# Patient Record
Sex: Female | Born: 1960 | Race: White | Hispanic: No | Marital: Married | Smoking: Never smoker
Health system: Southern US, Community
[De-identification: ages and names within clinical notes are randomized; demographics above are authoritative.]

## PROBLEM LIST (undated history)

## (undated) DIAGNOSIS — R112 Nausea with vomiting, unspecified: Secondary | ICD-10-CM

## (undated) DIAGNOSIS — N393 Stress incontinence (female) (male): Secondary | ICD-10-CM

## (undated) DIAGNOSIS — M1990 Unspecified osteoarthritis, unspecified site: Secondary | ICD-10-CM

## (undated) DIAGNOSIS — T8859XA Other complications of anesthesia, initial encounter: Secondary | ICD-10-CM

## (undated) DIAGNOSIS — Z973 Presence of spectacles and contact lenses: Secondary | ICD-10-CM

## (undated) DIAGNOSIS — N649 Disorder of breast, unspecified: Secondary | ICD-10-CM

## (undated) HISTORY — PX: OTHER SURGICAL HISTORY: SHX169

## (undated) HISTORY — DX: Disorder of breast, unspecified: N64.9

---

## 1979-07-13 HISTORY — PX: OTHER SURGICAL HISTORY: SHX169

## 1999-07-13 HISTORY — PX: GANGLION CYST EXCISION: SHX1691

## 2005-11-09 HISTORY — PX: OTHER SURGICAL HISTORY: SHX169

## 2006-11-16 ENCOUNTER — Encounter: Admission: RE | Admit: 2006-11-16 | Discharge: 2006-11-16 | Payer: Self-pay | Admitting: Obstetrics and Gynecology

## 2007-07-13 DIAGNOSIS — W57XXXA Bitten or stung by nonvenomous insect and other nonvenomous arthropods, initial encounter: Secondary | ICD-10-CM

## 2007-07-13 HISTORY — PX: OTHER SURGICAL HISTORY: SHX169

## 2007-07-13 HISTORY — DX: Bitten or stung by nonvenomous insect and other nonvenomous arthropods, initial encounter: W57.XXXA

## 2008-09-18 ENCOUNTER — Encounter: Admission: RE | Admit: 2008-09-18 | Discharge: 2008-09-18 | Payer: Self-pay | Admitting: Obstetrics and Gynecology

## 2009-07-12 HISTORY — PX: BLADDER SUSPENSION: SHX72

## 2009-07-12 HISTORY — PX: VAGINAL HYSTERECTOMY: SUR661

## 2009-07-12 HISTORY — PX: RECTOCELE REPAIR: SHX761

## 2010-07-12 HISTORY — PX: UPPER GI ENDOSCOPY: SHX6162

## 2015-10-20 DIAGNOSIS — Z6820 Body mass index (BMI) 20.0-20.9, adult: Secondary | ICD-10-CM | POA: Diagnosis not present

## 2015-10-20 DIAGNOSIS — R1032 Left lower quadrant pain: Secondary | ICD-10-CM | POA: Diagnosis not present

## 2015-11-12 DIAGNOSIS — H6240 Otitis externa in other diseases classified elsewhere, unspecified ear: Secondary | ICD-10-CM | POA: Diagnosis not present

## 2015-11-12 DIAGNOSIS — B369 Superficial mycosis, unspecified: Secondary | ICD-10-CM | POA: Diagnosis not present

## 2015-11-12 DIAGNOSIS — Z6820 Body mass index (BMI) 20.0-20.9, adult: Secondary | ICD-10-CM | POA: Diagnosis not present

## 2015-11-17 DIAGNOSIS — R102 Pelvic and perineal pain: Secondary | ICD-10-CM | POA: Diagnosis not present

## 2015-11-17 DIAGNOSIS — Z90710 Acquired absence of both cervix and uterus: Secondary | ICD-10-CM | POA: Diagnosis not present

## 2015-12-09 DIAGNOSIS — H9211 Otorrhea, right ear: Secondary | ICD-10-CM | POA: Diagnosis not present

## 2015-12-09 DIAGNOSIS — Z6820 Body mass index (BMI) 20.0-20.9, adult: Secondary | ICD-10-CM | POA: Diagnosis not present

## 2015-12-12 DIAGNOSIS — H60311 Diffuse otitis externa, right ear: Secondary | ICD-10-CM | POA: Diagnosis not present

## 2016-01-02 DIAGNOSIS — Z6820 Body mass index (BMI) 20.0-20.9, adult: Secondary | ICD-10-CM | POA: Diagnosis not present

## 2016-01-02 DIAGNOSIS — H60311 Diffuse otitis externa, right ear: Secondary | ICD-10-CM | POA: Diagnosis not present

## 2016-06-11 DIAGNOSIS — H60543 Acute eczematoid otitis externa, bilateral: Secondary | ICD-10-CM | POA: Diagnosis not present

## 2016-06-11 DIAGNOSIS — Z6820 Body mass index (BMI) 20.0-20.9, adult: Secondary | ICD-10-CM | POA: Diagnosis not present

## 2016-07-13 DIAGNOSIS — Z01419 Encounter for gynecological examination (general) (routine) without abnormal findings: Secondary | ICD-10-CM | POA: Diagnosis not present

## 2016-07-13 DIAGNOSIS — Z6820 Body mass index (BMI) 20.0-20.9, adult: Secondary | ICD-10-CM | POA: Diagnosis not present

## 2016-07-13 DIAGNOSIS — N951 Menopausal and female climacteric states: Secondary | ICD-10-CM | POA: Diagnosis not present

## 2017-04-04 DIAGNOSIS — Z6820 Body mass index (BMI) 20.0-20.9, adult: Secondary | ICD-10-CM | POA: Diagnosis not present

## 2017-04-04 DIAGNOSIS — K591 Functional diarrhea: Secondary | ICD-10-CM | POA: Diagnosis not present

## 2017-10-21 DIAGNOSIS — Z780 Asymptomatic menopausal state: Secondary | ICD-10-CM | POA: Diagnosis not present

## 2017-10-21 DIAGNOSIS — R159 Full incontinence of feces: Secondary | ICD-10-CM | POA: Diagnosis not present

## 2017-10-21 DIAGNOSIS — Z0000 Encounter for general adult medical examination without abnormal findings: Secondary | ICD-10-CM | POA: Diagnosis not present

## 2017-10-21 DIAGNOSIS — Z833 Family history of diabetes mellitus: Secondary | ICD-10-CM | POA: Diagnosis not present

## 2017-10-21 DIAGNOSIS — Z1331 Encounter for screening for depression: Secondary | ICD-10-CM | POA: Diagnosis not present

## 2017-11-29 DIAGNOSIS — K580 Irritable bowel syndrome with diarrhea: Secondary | ICD-10-CM | POA: Diagnosis not present

## 2017-11-29 DIAGNOSIS — R159 Full incontinence of feces: Secondary | ICD-10-CM | POA: Diagnosis not present

## 2018-01-02 DIAGNOSIS — H60543 Acute eczematoid otitis externa, bilateral: Secondary | ICD-10-CM | POA: Diagnosis not present

## 2018-02-03 DIAGNOSIS — M19042 Primary osteoarthritis, left hand: Secondary | ICD-10-CM | POA: Diagnosis not present

## 2018-02-03 DIAGNOSIS — M2550 Pain in unspecified joint: Secondary | ICD-10-CM | POA: Diagnosis not present

## 2018-02-03 DIAGNOSIS — M19041 Primary osteoarthritis, right hand: Secondary | ICD-10-CM | POA: Diagnosis not present

## 2018-03-03 DIAGNOSIS — M19049 Primary osteoarthritis, unspecified hand: Secondary | ICD-10-CM | POA: Diagnosis not present

## 2019-01-01 DIAGNOSIS — Z1331 Encounter for screening for depression: Secondary | ICD-10-CM | POA: Diagnosis not present

## 2019-01-01 DIAGNOSIS — N951 Menopausal and female climacteric states: Secondary | ICD-10-CM | POA: Diagnosis not present

## 2019-01-01 DIAGNOSIS — Z79899 Other long term (current) drug therapy: Secondary | ICD-10-CM | POA: Diagnosis not present

## 2019-01-01 DIAGNOSIS — Z780 Asymptomatic menopausal state: Secondary | ICD-10-CM | POA: Diagnosis not present

## 2019-01-01 DIAGNOSIS — Z136 Encounter for screening for cardiovascular disorders: Secondary | ICD-10-CM | POA: Diagnosis not present

## 2019-01-01 DIAGNOSIS — E538 Deficiency of other specified B group vitamins: Secondary | ICD-10-CM | POA: Diagnosis not present

## 2019-03-13 DIAGNOSIS — H524 Presbyopia: Secondary | ICD-10-CM | POA: Diagnosis not present

## 2019-03-16 DIAGNOSIS — M8589 Other specified disorders of bone density and structure, multiple sites: Secondary | ICD-10-CM | POA: Diagnosis not present

## 2019-03-16 DIAGNOSIS — Z1231 Encounter for screening mammogram for malignant neoplasm of breast: Secondary | ICD-10-CM | POA: Diagnosis not present

## 2019-05-15 DIAGNOSIS — G5603 Carpal tunnel syndrome, bilateral upper limbs: Secondary | ICD-10-CM | POA: Diagnosis not present

## 2019-05-15 DIAGNOSIS — M7711 Lateral epicondylitis, right elbow: Secondary | ICD-10-CM | POA: Diagnosis not present

## 2019-05-23 DIAGNOSIS — G5601 Carpal tunnel syndrome, right upper limb: Secondary | ICD-10-CM | POA: Diagnosis not present

## 2019-05-30 DIAGNOSIS — G5601 Carpal tunnel syndrome, right upper limb: Secondary | ICD-10-CM | POA: Diagnosis not present

## 2019-05-30 DIAGNOSIS — M1711 Unilateral primary osteoarthritis, right knee: Secondary | ICD-10-CM | POA: Diagnosis not present

## 2021-02-02 ENCOUNTER — Other Ambulatory Visit: Payer: Self-pay | Admitting: Nurse Practitioner

## 2021-02-02 DIAGNOSIS — N6489 Other specified disorders of breast: Secondary | ICD-10-CM

## 2021-02-09 DIAGNOSIS — N649 Disorder of breast, unspecified: Secondary | ICD-10-CM

## 2021-02-09 HISTORY — DX: Disorder of breast, unspecified: N64.9

## 2021-02-16 ENCOUNTER — Other Ambulatory Visit: Payer: Self-pay | Admitting: Nurse Practitioner

## 2021-02-16 ENCOUNTER — Ambulatory Visit
Admission: RE | Admit: 2021-02-16 | Discharge: 2021-02-16 | Disposition: A | Discharge status: Home / Self Care | Payer: Medicaid Other | Source: Ambulatory Visit | Attending: Nurse Practitioner | Admitting: Nurse Practitioner

## 2021-02-16 ENCOUNTER — Other Ambulatory Visit: Payer: Self-pay

## 2021-02-16 DIAGNOSIS — N6489 Other specified disorders of breast: Secondary | ICD-10-CM

## 2021-02-16 HISTORY — PX: BREAST BIOPSY: SHX20

## 2021-02-18 ENCOUNTER — Encounter: Payer: Self-pay | Admitting: Obstetrics & Gynecology

## 2021-02-18 ENCOUNTER — Ambulatory Visit: Payer: 59 | Admitting: Obstetrics & Gynecology

## 2021-02-18 ENCOUNTER — Other Ambulatory Visit: Payer: Self-pay

## 2021-02-18 VITALS — BP 147/66 | HR 76 | Ht 62.0 in | Wt 114.0 lb

## 2021-02-18 DIAGNOSIS — N993 Prolapse of vaginal vault after hysterectomy: Secondary | ICD-10-CM

## 2021-02-18 DIAGNOSIS — N8110 Cystocele, unspecified: Secondary | ICD-10-CM

## 2021-02-18 NOTE — Progress Notes (Signed)
History:  60 y.o. [id number] here today for eval of presumed pelvic organ prolaspe. Pt reports a bluge of an 'egg' from the vagina. This has been present for ~1-2 months. Pt reports a prior surgery in 2011 done by her GYN in conjunction with urology. She knows that she had vaginal repair of a rectocele with a hysterectomy and repair of bladder prolaspe. She reports urinary retention prior to the procedure and had to have her bladder drained of large amounts of urine prior to the procedure. She is not clear on the other procedures. She recently (<1 week prev) passed sutures from her rectum. Pt brought the strands of several sutures that she passed from the rectum. Pt also reports leakage of urine. She wakes multiple times in the night and has to change her PJs due to leaking of urine.   Pt is married and is sexually active.   The following portions of the patient's history were reviewed and updated as appropriate: allergies, current medications, past family history, past medical history, past social history, past surgical history and problem list.  Review of Systems:  Pertinent items are noted in HPI.    Objective:  Physical Exam Blood pressure (!) 147/66, pulse 76, height '5\' 2"'$  (1.575 m), weight 114 lb (51.7 kg).  CONSTITUTIONAL: Well-developed, well-nourished female in no acute distress.  HENT:  Normocephalic, atraumatic EYES: Conjunctivae and EOM are normal. No scleral icterus.  NECK: Normal range of motion SKIN: Skin is warm and dry. No rash noted. Not diaphoretic.No pallor. [place]: Alert and oriented to person, place, and time. Normal coordination.  Abd: Soft, nontender and nondistended. No incisions.  Pelvic: Normal appearing external genitalia; The vagina mucosa is 'bunched up' in the midline. There is a cystocele that prolapses to the introitus. The vaginal cuff is also prolapsed. The rectal exam suggests a sling around the rectum and it feels like there are sutures that are palpable. The  rectum does not appear to be prolapsed.     Labs and Imaging MM CLIP PLACEMENT LEFT  Result Date: 02/16/2021 CLINICAL DATA:  Evaluate post biopsy marker clip placements following stereotactic core needle biopsy architectural distortions, 1 in each breast. EXAM: 3D DIAGNOSTIC BILATERAL MAMMOGRAM POST STEREOTACTIC BIOPSY COMPARISON:  Previous exam(s). FINDINGS: 3D Mammographic images were obtained following stereotactic guided biopsy of a lateral left breast architectural distortion. The biopsy marking clip is in expected position at the site of biopsy. 3D Mammographic images were obtained following stereotactic guided biopsy of a right breast architectural distortion. The biopsy marking clip is in expected position at the site of biopsy. IMPRESSION: Appropriate positioning of the coil shaped biopsy marking clip at the site of biopsy in the lateral left breast. Appropriate positioning of the ribbon shaped biopsy marking clip at the site of biopsy in the lateral right breast. Final Assessment: Post Procedure Mammograms for Marker Placement Electronically Signed   By: Lajean Manes M.D.   On: 02/16/2021 09:51  MM CLIP PLACEMENT RIGHT  Result Date: 02/16/2021 CLINICAL DATA:  Evaluate post biopsy marker clip placements following stereotactic core needle biopsy architectural distortions, 1 in each breast. EXAM: 3D DIAGNOSTIC BILATERAL MAMMOGRAM POST STEREOTACTIC BIOPSY COMPARISON:  Previous exam(s). FINDINGS: 3D Mammographic images were obtained following stereotactic guided biopsy of a lateral left breast architectural distortion. The biopsy marking clip is in expected position at the site of biopsy. 3D Mammographic images were obtained following stereotactic guided biopsy of a right breast architectural distortion. The biopsy marking clip is in expected position at the site  of biopsy. IMPRESSION: Appropriate positioning of the coil shaped biopsy marking clip at the site of biopsy in the lateral left breast.  Appropriate positioning of the ribbon shaped biopsy marking clip at the site of biopsy in the lateral right breast. Final Assessment: Post Procedure Mammograms for Marker Placement Electronically Signed   By: Lajean Manes M.D.   On: 02/16/2021 09:51  MM LT BREAST BX W LOC DEV 1ST LESION IMAGE BX SPEC STEREO GUIDE  Result Date: 02/16/2021 CLINICAL DATA:  Patient presents for stereotactic core needle biopsy of architectural distortions in each breast. EXAM: LEFT BREAST STEREOTACTIC CORE NEEDLE BIOPSY: #1. RIGHT BREAST STEREOTACTIC CORE NEEDLE BIOPSY: #2. COMPARISON:  Previous exams. FINDINGS: The patient and I discussed the procedure of stereotactic-guided biopsy including benefits and alternatives. We discussed the high likelihood of a successful procedure. We discussed the risks of the procedure including infection, bleeding, tissue injury, clip migration, and inadequate sampling. Informed written consent was given. The usual time out protocol was performed immediately prior to the procedure. Lesion #1: Left breast, lower, outer quadrant. Using sterile technique and 1% Lidocaine as local anesthetic, under stereotactic guidance, a 9 gauge vacuum assisted device was used to perform core needle biopsy of calcifications in the lower outer quadrant of the left breast using a superior approach. Lesion quadrant: Lower outer quadrant At the conclusion of the procedure, coil shaped tissue marker clip was deployed into the biopsy cavity. Lesion #2: Right breast, lower, outer quadrant. Using sterile technique and 1% Lidocaine as local anesthetic, under stereotactic guidance, a 9 gauge vacuum assisted device was used to perform core needle biopsy of calcifications in the lower outer quadrant of the left breast using a superior approach. Lesion quadrant: Lower outer quadrant At the conclusion of the procedure, ribbon shaped tissue marker clip was deployed into the biopsy cavity. Follow-up 2-view mammogram was performed and  dictated separately. IMPRESSION: Stereotactic-guided biopsy of architectural distortions, 1 in each breast. No apparent complications. Electronically Signed   By: Lajean Manes M.D.   On: 02/16/2021 09:45  MM RT BREAST BX W LOC DEV 1ST LESION IMAGE BX SPEC STEREO GUIDE  Result Date: 02/16/2021 CLINICAL DATA:  Patient presents for stereotactic core needle biopsy of architectural distortions in each breast. EXAM: LEFT BREAST STEREOTACTIC CORE NEEDLE BIOPSY: #1. RIGHT BREAST STEREOTACTIC CORE NEEDLE BIOPSY: #2. COMPARISON:  Previous exams. FINDINGS: The patient and I discussed the procedure of stereotactic-guided biopsy including benefits and alternatives. We discussed the high likelihood of a successful procedure. We discussed the risks of the procedure including infection, bleeding, tissue injury, clip migration, and inadequate sampling. Informed written consent was given. The usual time out protocol was performed immediately prior to the procedure. Lesion #1: Left breast, lower, outer quadrant. Using sterile technique and 1% Lidocaine as local anesthetic, under stereotactic guidance, a 9 gauge vacuum assisted device was used to perform core needle biopsy of calcifications in the lower outer quadrant of the left breast using a superior approach. Lesion quadrant: Lower outer quadrant At the conclusion of the procedure, coil shaped tissue marker clip was deployed into the biopsy cavity. Lesion #2: Right breast, lower, outer quadrant. Using sterile technique and 1% Lidocaine as local anesthetic, under stereotactic guidance, a 9 gauge vacuum assisted device was used to perform core needle biopsy of calcifications in the lower outer quadrant of the left breast using a superior approach. Lesion quadrant: Lower outer quadrant At the conclusion of the procedure, ribbon shaped tissue marker clip was deployed into the biopsy cavity.  Follow-up 2-view mammogram was performed and dictated separately. IMPRESSION:  Stereotactic-guided biopsy of architectural distortions, 1 in each breast. No apparent complications. Electronically Signed   By: Lajean Manes M.D.   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 Will send Dr. Bayard Beaver a note as well.  Requested records from [facility] where surgery occurred and prev GYN records.   Total face-to-face time with patient, review of chart, discussion with consultant and coordination of care was 42mn.    Raelin Pixler L. Harraway-Smith, M.D., FCherlynn June

## 2021-02-18 NOTE — Progress Notes (Signed)
Patient has hx of hysterectomy with rectocele repair.  (Former patient of Dr. Kris Mouton).  Just recently had breast biopsies done. ([facility]) Kathrene Alu RN

## 2021-03-11 ENCOUNTER — Telehealth: Payer: Self-pay | Admitting: General Practice

## 2021-03-11 NOTE — Telephone Encounter (Signed)
Records requested from Dr. Tobie Poet (OB/GYN) who is now retired.  Records can be found in Care Everywhere.

## 2021-03-13 ENCOUNTER — Telehealth: Payer: Self-pay | Admitting: General Practice

## 2021-03-13 NOTE — Telephone Encounter (Signed)
ROI sent to [facility] for operation of bladder and hysterectomy notes.  Medical Records only had radiology report on file.  Radiology report can be found in [place] with a service date of 11/17/2015.

## 2021-05-05 ENCOUNTER — Ambulatory Visit (INDEPENDENT_AMBULATORY_CARE_PROVIDER_SITE_OTHER): Payer: 59 | Admitting: Obstetrics and Gynecology

## 2021-05-05 ENCOUNTER — Other Ambulatory Visit: Payer: Self-pay

## 2021-05-05 ENCOUNTER — Encounter: Payer: Self-pay | Admitting: Obstetrics and Gynecology

## 2021-05-05 VITALS — BP 154/76 | HR 82 | Ht 62.0 in | Wt 114.0 lb

## 2021-05-05 DIAGNOSIS — R159 Full incontinence of feces: Secondary | ICD-10-CM

## 2021-05-05 DIAGNOSIS — R350 Frequency of micturition: Secondary | ICD-10-CM | POA: Diagnosis not present

## 2021-05-05 DIAGNOSIS — N993 Prolapse of vaginal vault after hysterectomy: Secondary | ICD-10-CM

## 2021-05-05 DIAGNOSIS — N8110 Cystocele, unspecified: Secondary | ICD-10-CM | POA: Diagnosis not present

## 2021-05-05 DIAGNOSIS — N393 Stress incontinence (female) (male): Secondary | ICD-10-CM

## 2021-05-05 DIAGNOSIS — N3941 Urge incontinence: Secondary | ICD-10-CM | POA: Diagnosis not present

## 2021-05-05 LAB — POCT URINALYSIS DIPSTICK
Appearance: NORMAL
Bilirubin, UA: NEGATIVE
Blood, UA: NEGATIVE
Glucose, UA: NEGATIVE
Ketones, UA: NEGATIVE
Leukocytes, UA: NEGATIVE
Nitrite, UA: NEGATIVE
Protein, UA: NEGATIVE
Spec Grav, UA: >=1.03 — AB (ref 1.010–1.025)
Urobilinogen, UA: 0.2 E.U./dL
pH, UA: 5 (ref 5.0–8.0)

## 2021-05-05 NOTE — Patient Instructions (Addendum)
Accidental Bowel Leakage: Our goal is to achieve formed bowel movements daily or every-other-day without leakage.  You may need to try different combinations of the following options to find what works best for you.  Some management options include: Dietary changes (more leafy greens, vegetables and fruits; less processed foods) Fiber supplementation (Metamucil or something with psyllium as active ingredient) Over-the-counter imodium (tablets or liquid) to help solidify the stool and prevent leakage of stool.    URODYNAMICS (UDS) TEST INFORMATION  IMPORTANT: Please try to arrive with a comfortably full bladder!  Complete a 3 day bladder diary prior to your appointment.  What is UDS? Urodynamics is a bladder test used to evaluate how your bladder and urethra (tube you urinate out of) work to help find out the cause of your bladder symptoms and evaluate your bladder function in order to make the best treatment plan for you.   What to expect? A nurse will perform the test and will be with you during the entire exam. First we will have to empty your bladder on a special toilet.  After you have emptied your bladder, very small catheters (plastic tubing) will be placed into your bladder and into your vagina (or rectum). These special small catheters measure pressure to help measure your bladder function.  Your bladder will be gently filled with water and you will be asked to cough and strain at several different points during the test.   You will then be asked to empty your bladder in the special toilet with the catheters in place. Most patients can urinate (pee) easily with the catheters in place since the catheters are so small. In total this procedure lasts about 45 minutes to 1 hour.  After your test is completed, you will return (or possibly be seen the same day) to review the results, talk about treatment options and make a plan moving forward.

## 2021-05-05 NOTE — Progress Notes (Signed)
[facility] New Patient Evaluation and Consultation  Referring Provider: Lavonia Drafts* PCP: Earlyne Iba, [facility] Date of Service: 05/05/2021  SUBJECTIVE Chief Complaint: New Patient (Initial Visit) Latoya Anderson is a 60 [place]. female here for a consult on prolapse./PVR 12)  History of Present Illness: Latoya Anderson is a 75 [place]. White or Caucasian female seen in consultation at the request of Dr. Ihor Dow for evaluation of prolapse.    Review of records significant for: Has history of hysterectomy, rectocele/ bladder prolapse repair in 2011. Has noticed sutures passing through the rectum. On exam, noted a sling around the rectum and it feels like there are sutures that are palpable.   Urinary Symptoms: Leaks urine with lifting, during sex, with urgency, and while asleep. UUI> SUI- Leaks a few times a week.  Pad use: none She is bothered by her UI symptoms. Very bothered by leakage with intercourse.  Before she had her previous prolapse repair, had urinary retention.   Day time voids- every hour (to prevent bladder from getting too full).  Nocturia: 1-2 times per night to void. Voiding dysfunction: she does not empty her bladder well.  does not use a catheter to empty bladder.  When urinating, she feels a weak stream and dribbling after finishing Drinks: water  UTIs:  0  UTI's in the last year.   Denies history of blood in urine and kidney or bladder stones  Pelvic Organ Prolapse Symptoms:                  She Admits to a feeling of a bulge the vaginal area. It has been present for 4 months.  She Admits to seeing a bulge.  This bulge is bothersome. In 2011, Pt reports a the time of hysterectomy, and had a mesh "patch" placed over the rectum, and a mesh bladder sling.   Has had some pain in the abdomen, then had some sutures come out of the rectum. Since then, the pain has improved.   Bowel Symptom: Bowel movements: usually every day Stool  consistency: hard Straining: yes.  Splinting: no.  Incomplete evacuation: yes.  She Admits to accidental bowel leakage / fecal incontinence  Occurs: several times per month  Consistency with leakage: soft  or liquid Bowel regimen: none Last colonoscopy: Date 2009, Results- negative. Has a history of IBS. Declines colonoscopy at this time.   Sexual Function Sexually active: yes.  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   Allergies: Patient is allergic to galactose, penicillins, sulfa antibiotics, and trimethoprim.   Social History:  Social History   Tobacco Use   Smoking status: Never   Smokeless tobacco: Never  Vaping Use   Vaping Use: Never used  Substance Use Topics   Alcohol use: Not Currently   Drug use: Never    Relationship status: married She lives with husband.   She is not employed. Regular exercise: No History of abuse: No  Family History:   Family History  Problem Relation Age of Onset   Cancer Father        lung   Diabetes Mother    Hypertension Mother    Cancer Brother        throat     Review of Systems: Review of Systems  Constitutional:  Negative for  fever, malaise/fatigue and weight loss.  Respiratory:  Negative for cough, shortness of breath and wheezing.   Cardiovascular:  Negative for chest pain, palpitations and leg swelling.  Gastrointestinal:  Negative for abdominal pain and blood in stool.  Genitourinary:  Negative for dysuria.  Musculoskeletal:  Negative for myalgias.  Skin:  Negative for rash.  Neurological:  Negative for dizziness and headaches.  Endo/Heme/Allergies:  Does not bruise/bleed easily.  Psychiatric/Behavioral:  Negative for depression. The patient is not nervous/anxious.     OBJECTIVE Physical Exam: Vitals:   05/05/21 1407  BP: (!) 154/76  Pulse: 82  Weight: 114 lb (51.7 kg)  Height: 5\' 2"  (1.575 m)    Physical Exam Constitutional:      General: She is not in acute distress. Pulmonary:     Effort: Pulmonary effort is normal.  Abdominal:     General: There is no distension.     Palpations: Abdomen is soft.     Tenderness: There is no abdominal tenderness. There is no rebound.  Musculoskeletal:        General: No swelling. Normal range of motion.  Skin:    General: Skin is warm and dry.     Findings: No rash.  Neurological:     Mental Status: She is alert and oriented to person, place, and time.  Psychiatric:        Mood and Affect: Mood normal.        Behavior: Behavior normal.     GU / Detailed Urogynecologic Evaluation:  Pelvic Exam: Normal external female genitalia; Bartholin's and Skene's glands normal in appearance; urethral meatus normal in appearance, no urethral masses or discharge.   CST: negative  s/p hysterectomy: Speculum exam reveals normal vaginal mucosa with  atrophy and normal vaginal cuff.  Adnexa no mass, fullness, tenderness.  Borders of mesh palpated on posterior vagina, no mesh fibers visible or palpable.    Pelvic floor strength I/V, puborectalis II/V external anal sphincter I/V  Pelvic floor musculature: Right levator non-tender, Right obturator non-tender, Left  levator non-tender, Left obturator non-tender  POP-Q:   POP-Q  0                                            Aa   0  Ba  -5.5                                              C   3                                            Gh  3.5                                            Pb  9                                            tvl   -2                                            Ap  -2                                            Bp                                                 D     Rectal Exam:  Normal sphincter tone, no distal rectocele, enterocoele not present, no rectal masses or foreign bodies, no sign of dyssynergia when asking the patient to bear down.  Post-Void Residual (PVR) by Bladder Scan: In order to evaluate bladder emptying, we discussed obtaining a postvoid residual and she agreed to this procedure.  Procedure: The ultrasound unit was placed on the patient's abdomen in the suprapubic region after the patient had voided. A PVR of 12 ml was obtained by bladder scan.  Laboratory Results: POC urine: negative   ASSESSMENT AND PLAN Ms. Rominger is a 34 [place]. with:  1. Prolapse of anterior vaginal wall   2. Vaginal vault prolapse after hysterectomy   3. Urinary frequency   4. Urge incontinence   5. SUI (stress urinary incontinence, female)   6. Incontinence of feces, unspecified fecal incontinence type    Stage II anterior, Stage I posterior, Stage I apical prolapse - For treatment of pelvic organ prolapse, we discussed options for management including expectant management, conservative management, and surgical management, such as Kegels, a pessary, pelvic floor physical therapy, and specific surgical procedures. - We discussed two options for prolapse repair:  1) vaginal repair without mesh - Pros - safer, no mesh complications - Cons - not as strong as mesh repair, higher risk of recurrence  2) laparoscopic repair with  mesh - Pros - stronger, better long-term success - Cons - risks of mesh implant (erosion into vagina or bladder, adhering to the rectum, pain) - these risks are lower than with a vaginal mesh  but still exist - She is interested in the procedure with higher success rate, robotic sacrocolpopexy.   2. Incontinence - Urge predominant. She is most bothered by leakage with intercourse which is typically SUI, but unclear with her history. Due to this and her history of prior sling, will undergo urodynamic testing. We discussed that urge incontinence will not necessarily improve with prolapse repair or repeat sling.   3. Fecal incontinence - Treatment options include anti-diarrhea medication (loperamide/ Imodium OTC or prescription lomotil), fiber supplements, physical therapy, and possible sacral neuromodulation or surgery.   - She will start with adding psyllium daily. - We also discussed the need for updated colonoscopy. She declines referral today- states she has other testing to be completed first.   Return for urodynamic testing  [name], MD   Medical Decision Making:  - Reviewed/ ordered a clinical laboratory test - Reviewed/ ordered medicine test - Review and summation of prior records

## 2021-05-19 ENCOUNTER — Ambulatory Visit: Payer: 59 | Admitting: Surgery

## 2021-05-19 ENCOUNTER — Encounter: Payer: Self-pay | Admitting: Surgery

## 2021-05-19 ENCOUNTER — Other Ambulatory Visit: Payer: Self-pay

## 2021-05-19 VITALS — BP 152/76 | HR 83 | Temp 98.1°F | Ht 62.5 in | Wt 115.6 lb

## 2021-05-19 DIAGNOSIS — N6091 Unspecified benign mammary dysplasia of right breast: Secondary | ICD-10-CM

## 2021-05-19 DIAGNOSIS — N6092 Unspecified benign mammary dysplasia of left breast: Secondary | ICD-10-CM

## 2021-05-19 DIAGNOSIS — N6489 Other specified disorders of breast: Secondary | ICD-10-CM

## 2021-05-19 NOTE — Progress Notes (Signed)
Patient ID: Latoya Anderson, female   DOB: 02-28-1961, 60 y.o.   MRN: [id number]  Chief Complaint: Bilateral complex sclerosing lesions.  History of Present Illness Latoya Anderson is a 60 y.o. 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  Patient with history of hormonal replacement therapy following her hysterectomy in 2011.  She reports the presence of both ovaries, but apparently is quite dependent upon her supplemental estrogen for her stated wellbeing, emotional balance, comfort.  She had gone I believe 5 to 6 years since her prior screening mammogram, her prescriber insisted on her having a mammogram and she followed through.  Her family history is significant for a maternal aunt.  We will apparently had cancer relatively early in life. She reports she began menstruating at the age of 58, she has had 3 pregnancy with the first delivered at the age of 60 years old.  She reports she breast-fed.  She has some breast discomfort.  But denies any known skin changes, nipple discharge or mass.  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    Family History Family History  Problem Relation Age of Onset   Diabetes Mother    Hypertension Mother    Cancer Father        lung   Cancer Brother        throat      Social History Social History   Tobacco Use   Smoking status: Never   Smokeless tobacco: Never  Vaping Use   Vaping Use: Never used  Substance Use Topics   Alcohol use: Not Currently   Drug use: Never        Review of Systems  Constitutional: Negative.   HENT: Negative.    Eyes: Negative.   Respiratory: Negative.    Cardiovascular: Negative.   Gastrointestinal: Negative.   Musculoskeletal: Negative.   Skin: Negative.   Neurological: Negative.   Psychiatric/Behavioral: Negative.       Physical Exam Blood pressure (!) 152/76, pulse 83, temperature 98.1 F (36.7 C), temperature source Oral, height 5' 2.5" (1.588 m), weight 115 lb 9.6 oz (52.4 kg), SpO2 98 %. Last Weight  Most recent update: 05/19/2021 10:10 AM    Weight  52.4 kg (115 lb 9.6 oz)             CONSTITUTIONAL: Well developed, and nourished, appropriately responsive and aware without distress.   EYES: Sclera non-icteric.   EARS, NOSE, MOUTH AND THROAT: Mask worn.  Hearing is intact to voice.  NECK: Trachea is midline, and there is no jugular venous distension.  LYMPH NODES:  Lymph nodes in the neck are not enlarged. RESPIRATORY:  Lungs are clear, and breath sounds are equal bilaterally. Normal respiratory effort without pathologic use of accessory muscles. CARDIOVASCULAR: Heart is regular in rate and rhythm. GI: The abdomen is soft, nontender, and nondistended.  GU: Levada Dy is present as chaperone: Breast examination completed, there is no evidence of suspicious nor dominant nodularity or masses present.  No skin changes, no nipple discharge or nipple areolar complex skin changes. MUSCULOSKELETAL:  Symmetrical muscle  tone appreciated in all four extremities.    SKIN: Skin turgor is normal. No pathologic skin lesions appreciated.  NEUROLOGIC:  Motor and sensation appear grossly normal.  Cranial nerves are grossly without defect. PSYCH:  Alert and oriented to person, place and time. Affect is appropriate for situation.  Data Reviewed I have personally reviewed what is currently available of the patient's imaging, recent labs and medical records.   Labs:  No flowsheet data found. No flowsheet data found.    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 This was found to be concordant by Dr. Lajean Manes, with surgical consultation to discuss options.   Pathology revealed COMPLEX SCLEROSING LESION of the RIGHT breast, lower outer quadrant, (ribbon clip). This was found to be concordant by Dr. Lajean Manes, with surgical consultation to discuss options.   Pathology results were discussed with the patient by telephone. The patient reported doing well after the biopsies with tenderness at the sites. Post biopsy instructions and care were reviewed and questions were answered. The patient was encouraged to call [facility] for any additional concerns. My direct phone number was provided.   Surgical consultation has been arranged with Dr. Rolm Bookbinder at [facility] on March 05, 2021.   NOTE: If surgical excision is not pursued, consider BILATERAL diagnostic mammogram every 6 months for 2 years.   Pathology results reported by Terie Purser, RN on 02/19/2021.     Electronically Signed   By: Lajean Manes M.D.   On: 02/19/2021 08:15    Addended by Lajean Manes, MD on 02/19/2021 10:24 AM   Study Result  Narrative & Impression  CLINICAL DATA:  Patient presents for stereotactic core needle biopsy of  architectural distortions in each breast.   EXAM: LEFT BREAST STEREOTACTIC CORE NEEDLE BIOPSY: #1.   RIGHT BREAST STEREOTACTIC CORE NEEDLE BIOPSY: #2.   COMPARISON:  Previous exams.   FINDINGS: The patient and I discussed the procedure of stereotactic-guided biopsy including benefits and alternatives. We discussed the high likelihood of a successful procedure. We discussed the risks of the procedure including infection, bleeding, tissue injury, clip migration, and inadequate sampling. Informed written consent was given. The usual time out protocol was performed immediately prior to the procedure.   Lesion #1: Left breast, lower, outer quadrant.   Using sterile technique and 1% Lidocaine as local anesthetic, under stereotactic guidance, a 9 gauge vacuum assisted device was used to perform core needle biopsy of calcifications in the lower outer quadrant of the left breast using a superior approach.   Lesion quadrant: Lower outer quadrant   At the conclusion of the procedure, coil shaped tissue marker clip was deployed into the biopsy cavity.   Lesion #2: Right breast, lower, outer quadrant.   Using  sterile technique and 1% Lidocaine as local anesthetic, under stereotactic guidance, a 9 gauge vacuum assisted device was used to perform core needle biopsy of calcifications in the lower outer quadrant of the left breast using a superior approach.   Lesion quadrant: Lower outer quadrant   At the conclusion of the procedure, ribbon shaped tissue marker clip was deployed into the biopsy cavity.   Follow-up 2-view mammogram was performed and dictated separately.   IMPRESSION: Stereotactic-guided biopsy of architectural distortions, 1 in each breast. No apparent complications.   Electronically Signed: By: Lajean Manes M.D. On: 02/16/2021 09:45   Within last 24 hrs: No results found.  Assessment    Bilateral complex sclerosing lesions, usual ductal hyperplasia in a  patient who continues to take estrogen replacement therapy.  There are no problems to display for this patient.   Plan    Reviewed the data below.  We discussed options including close observation with biannual imaging and clinical examinations, versus proceeding with bilateral lumpectomies, or bilateral mastectomies.  We discussed regardless there will always be a degree of uncertainty in terms of imaging, and eliminating premalignant risk. Since it has been since June for her original imaging, she would like to have her follow-up diagnostic bilateral mammography in December is on schedule.  We will anticipate reimaging every 6 months with clinical examination.  I am unconvinced that there is a role for MRI at this time.  Perhaps were better off getting her first set of follow-up imaging prior to launching off on any additional modalities.  Radial scars -- Complex Sclerosing Lesion (CSL) Radial scars, also called complex sclerosing lesions, are a pathologic diagnosis, usually discovered incidentally when a breast mass or radiologic abnormality is removed or biopsied. Occasionally, radial scars are large enough to be detected on mammography as suspicious spiculated masses, which cannot be reliably differentiated from spiculated carcinoma by imaging alone. Radial scars are characterized microscopically by a fibroelastic core with radiating ducts and lobules. In general, surgical excision is recommended when radial scars or complex sclerosing lesions are diagnosed on core biopsy, based on series showing that 8 to 17 percent of surgical specimens at subsequent excision are positive for malignancy. These high upgrade rates have led to the suggestion that these lesions may actually be premalignant lesions, progressing from scar to hyperplasia to carcinoma. This, however, is controversial, and more recent series suggest the presence of undiagnosed cancer is much lower, with upgrade rates in the range of 0.6 to  3.6 percent. The upgrade rate is even lower with large-volume (eg, vacuum-assisted) needle biopsy.   In a meta-analysis of over 3000 patients with radial scars, the upgrade rate after vacuum-assisted biopsy to invasive and in situ cancer was only 0 and 1 percent, respectively. In a study of 50 patients with radial scar who were followed by active surveillance, no patient progressed on interval imaging at 16 months. The current recommendations from the [place] of [employer] state that most radial scars "should be excised, although imaging follow-up is reasonable for small, image-detected radial scars that are completely removed or well-sampled with large-gauge devices and in the setting of imaging-pathology concordance".  No additional treatment beyond excision is needed for radial scars. The risk of subsequent breast cancer after excision in this population is small, and chemoprevention is not indicated.   Face-to-face time spent with the patient and accompanying care providers(if present) was 45 minutes, with more than 50% of the time spent counseling, educating, and coordinating care of the patient.  These notes generated with voice recognition software. I apologize for typographical errors.  Ronny Bacon M.D., FACS 05/19/2021, 12:06 PM

## 2021-05-19 NOTE — Patient Instructions (Addendum)
You may call [facility] at  [phone] to schedule your December Mammogram. If you have any concerns or questions, please feel free to call our office.  Atypical Hyperplasia of the Breast Atypical hyperplasia of the breast is a condition in which some cells in the breast are unusual or abnormal. The cells can be found in the ducts or lobules of the breast. Compared to normal breast cells, the cells seen in atypical hyperplasia: Grow faster than normal breast cells. Are organized in an unusual way. Grow in larger numbers than normal breast cells (overproduction). Are unusual in size and shape. Atypical hyperplasia is not a form of breast cancer. However, it may: Make you more likely to develop breast cancer. Turn into cancer (pre-cancerous), if it is not treated. What are the causes? The cause of this condition is not known. What increases the risk? The following factors may make you more likely to develop this condition: Age. Your risk increases as you get older. Certain inherited genes. Family or personal history of breast cancer. Having prior radiation treatments to your breasts or chest area. Obesity. Using or having used hormones, such as estrogen. Starting menstruation before the age of 41. Going through menopause after age 72. Drinking more than one alcoholic beverage a day. Never having given birth. Giving birth to your first child when you were over the age of 2. Not breastfeeding, if you have given birth. What are the signs or symptoms? There are no symptoms of this condition. How is this diagnosed? This condition is usually discovered during a routine X-ray study of the breasts (mammogram). If something concerning is found on a mammogram, a biopsy is performed. This means that a small sample of breast tissue is removed and sent to a lab. In most cases, breast cells can be removed through a needle. Sometimes, a small cut (incision) will need to be made in the  breast to remove a sample of breast tissue. Atypical hyperplasia can then be diagnosed by examining the breast cells under a microscope. How is this treated? This condition may be treated with: More frequent monitoring of breast health with: Clinical breast exams. Mammograms. Ultrasounds. MRIs. Medicines to keep the abnormal cells from spreading and becoming cancerous (chemoprevention). A lumpectomy. This is the removal of the area of abnormal cells, along with some of the normal tissue in the area. This may also be called breast-conserving surgery. A simple mastectomy. This is the removal of breast tissue, the nipple, and the circle of colored tissue around the nipple (areola). Sometimes, one or more lymph nodes from under the arm are also removed. A preventive mastectomy. This is the removal of both breasts. This is usually only done if you have a very high risk of developing breast cancer. Follow these instructions at home: Breast health Examine your breasts after every menstrual period. If you do not have menstrual periods, check your breasts on the first day of every month. Feel for changes in your breasts, such as: More tenderness. A new growth. A change in size or shape. A change in an existing lump. Alcohol use Do not drink alcohol if: Your health care provider tells you not to drink. You are pregnant, may be pregnant, or are planning to become pregnant. If you drink alcohol, limit how much you have to 0-1 drink a day. Be aware of how much alcohol is in your drink. In the [place] one drink equals one typical bottle of beer (12 oz), one-half glass of wine (  5 oz), or one shot of hard liquor (1 oz). General instructions Take over-the-counter and prescription medicines only as told by your health care provider. Eat a healthy diet. This includes low-fat dairy products, lean meats, and fiber-rich foods, such as fruits, vegetables, and whole grains. To get more fiber: Make sure half your  plate is filled with fruits or vegetables. Choose high-fiber foods such as whole-grain breads and cereals. Do not use any products that contain nicotine or tobacco, such as cigarettes and e-cigarettes. If you need help quitting, ask your health care provider. Keep all follow-up visits as told by your health care provider. This is important. Contact a health care provider if you have: A fever. A change in shape or size of your breasts or nipples. A change in the skin of your breast or nipples, such as a reddened or scaly area. Unusual discharge from your nipples. A lump or thick area that was not there before. Pain in your breasts. Anything that concerns you. Get help right away if you have: Trouble breathing. Chest pain. Redness of your breast and the redness is spreading. Summary Atypical hyperplasia of the breast is a condition in which some cells in the breast are unusual or abnormal. Atypical hyperplasia is not a form of breast cancer. However, it may put you at greater risk for developing breast cancer. This condition is usually discovered during a routine X-ray study of the breasts (mammogram). Examine your breasts after every menstrual period. If you do not have menstrual periods, check your breasts on the first day of every month. Let your health care provider know if you notice any changes in your breasts. This information is not intended to replace advice given to you by your health care provider. Make sure you discuss any questions you have with your health care provider. Document Revised: 08/02/2019 Document Reviewed: 07/28/2017 Elsevier Patient Education  2022 [employer].

## 2021-05-26 ENCOUNTER — Other Ambulatory Visit: Payer: Self-pay

## 2021-05-26 ENCOUNTER — Ambulatory Visit (INDEPENDENT_AMBULATORY_CARE_PROVIDER_SITE_OTHER): Payer: 59 | Admitting: Obstetrics and Gynecology

## 2021-05-26 VITALS — BP 190/73 | HR 85 | Wt 115.0 lb

## 2021-05-26 DIAGNOSIS — R350 Frequency of micturition: Secondary | ICD-10-CM | POA: Diagnosis not present

## 2021-05-26 DIAGNOSIS — N393 Stress incontinence (female) (male): Secondary | ICD-10-CM

## 2021-05-26 LAB — POCT URINALYSIS DIPSTICK
Appearance: NORMAL
Bilirubin, UA: NEGATIVE
Glucose, UA: NEGATIVE
Ketones, UA: NEGATIVE
Leukocytes, UA: NEGATIVE
Nitrite, UA: NEGATIVE
Protein, UA: NEGATIVE
Spec Grav, UA: 1.02 (ref 1.010–1.025)
Urobilinogen, UA: 0.2 E.U./dL
pH, UA: 5.5 (ref 5.0–8.0)

## 2021-05-26 NOTE — Patient Instructions (Signed)

## 2021-05-28 NOTE — Progress Notes (Signed)
[facility] Urodynamics Procedure  Referring Physician: Earlyne Iba, NP Date of Procedure: 05/26/2021  Latoya Anderson is a 60 y.o. female who presents for urodynamic evaluation. Indication(s) for study: SUI and UUI. Has previous sling for SUI.   Vital Signs: BP (!) 190/73   Pulse 85   Wt 115 lb (52.2 kg)   BMI 20.70 kg/m   Laboratory Results: A catheterized urine specimen revealed:  POC urine: negative  Voiding Diary: Not performed  Procedure Timeout:  The correct patient was verified and the correct procedure was verified. The patient was in the correct position and safety precautions were reviewed based on at the patient's history.  Urodynamic Procedure A 261F dual lumen urodynamics catheter was placed under sterile conditions into the patient's bladder. A 261F catheter was placed into the rectum in order to measure abdominal pressure. EMG patches were placed in the appropriate position.  All connections were confirmed and calibrations/adjusted made. Saline was instilled into the bladder through the dual lumen catheters.  Cough/valsalva pressures were measured periodically during filling.  Patient was allowed to void.  The bladder was then emptied of its residual.  UROFLOW: Revealed a Qmax of 30 mL/sec.  She voided 284 mL and had a residual of 8 mL.  It was a interrupted pattern and represented normal habits.  CMG: This was performed with sterile water in the sitting position at a fill rate of 20-30 mL/min.    First sensation of fullness was 28 mLs,  First urge was 50 mLs,  Strong urge was 66 mLs and  Capacity was 137 mLs  Stress incontinence was demonstrated Highest negative Barrier CLPP was 114 cmH20 at 100 ml. Lowest positive Barrier VLPP was 77 cmH20 at 100 ml.  Detrusor function was normal    Compliance:  normal.   UPP: MUCP with barrier reduction was 48 cm of water.    MICTURITION STUDY: Voiding was performed with reduction using scopettes in  the sitting position.  Pdet at Qmax was 11.3 cm of water.  Qmax was 8.1 mL/sec.  It was a normal pattern.  She voided 134 mL and had a residual of 3 mL.  It was a volitional void, sustained detrusor contraction was present and abdominal straining was present  EMG: This was performed with patches.  She had voluntary contractions, recruitment with fill was present and urethral sphincter was and was not relaxed with void.  The details of the procedure with the study tracings have been scanned into EPIC.   Urodynamic Impression:  1. Sensation was increased; capacity was reduced 2. Stress Incontinence was demonstrated at normal pressures; 3. Detrusor Overactivity was not demonstrated. 4. Emptying was normal with a normal PVR, a sustained detrusor contraction present,  abdominal straining present, dyssynergic urethral sphincter activity on EMG.  Plan: - The patient will follow up  to discuss the findings and treatment options.

## 2021-06-01 NOTE — Progress Notes (Signed)
[facility] Return Visit  SUBJECTIVE  History of Present Illness: Latoya Anderson is a 60 y.o. female seen in follow-up for after urodynamic testing to discuss surgery.   Urodynamic Impression:  1. Sensation was increased; capacity was reduced (137 ml) 2. Stress Incontinence was demonstrated at normal pressures; 3. Detrusor Overactivity was not demonstrated. 4. Emptying was normal with a normal PVR, a sustained detrusor contraction present,  abdominal straining present, dyssynergic urethral sphincter activity on EMG.  Past Medical History: Patient  has a past medical history of Breast disorder.   Past Surgical History: She  has a past surgical history that includes Vaginal hysterectomy (07/2009); Rectocele repair (07/2009); Bladder suspension (07/2009); galbladder (11/2005); left hand surgery (1981); and Ganglion cyst excision (Right, 2001).   Medications: She has a current medication list which includes the following prescription(s): acetaminophen, estradiol, ibuprofen, oxycodone, and polyethylene glycol powder.   Allergies: Patient is allergic to alpha-gal, galactose, penicillins, sulfa antibiotics, and trimethoprim.   Social History: Patient  reports that she has never smoked. She has never used smokeless tobacco. She reports that she does not drink alcohol and does not use drugs.      OBJECTIVE     Physical Exam: Vitals:   06/03/21 1133  BP: (!) 158/76  Pulse: 76   Gen: No apparent distress, A&O x 3.  Detailed Urogynecologic Evaluation:  Deferred. Prior exam showed:  POP-Q:    POP-Q   0                                            Aa   0                                           Ba   -5.5                                              C    3                                            Gh   3.5                                            Pb   9                                            tvl    -2                                            Ap    -2  Bp                                                  D        ASSESSMENT AND PLAN    [name] is a 60 y.o. with:  1. SUI (stress urinary incontinence, female)   2. Prolapse of anterior vaginal wall   3. Vaginal vault prolapse after hysterectomy     Plan for surgery: Exam under anesthesia, Robotic assisted sacrocolpopexy, midurethral sling, cystoscopy  - We reviewed the patient's specific anatomic and functional findings, with the assistance of diagrams, and together finalized the above procedure. The planned surgical procedures were discussed along with the surgical risks outlined below, which were also provided on a detailed handout. Additional treatment options including expectant management, conservative management, medical management were discussed where appropriate.  We reviewed the benefits and risks of each treatment option.   General Surgical Risks: For all procedures, there are risks of bleeding, infection, damage to surrounding organs including but not limited to bowel, bladder, blood vessels, ureters and nerves, and need for further surgery if an injury were to occur. These risks are all low with minimally invasive surgery.   There are risks of numbness and weakness at any body site or buttock/rectal pain.  It is possible that baseline pain can be worsened by surgery, either with or without mesh. If surgery is vaginal, there is also a low risk of possible conversion to laparoscopy or open abdominal incision where indicated. Very rare risks include blood transfusion, blood clot, heart attack, pneumonia, or death.   There is also a risk of short-term postoperative urinary retention with need to use a catheter. About half of patients need to go home from surgery with a catheter, which is then later removed in the office. The risk of long-term need for a catheter is very low. There is also a risk of worsening of overactive bladder.    Sling: The effectiveness of a midurethral vaginal mesh sling is approximately 85%, and thus, there will be times when you may leak urine after surgery, especially if your bladder is full or if you have a strong cough. There is a balance between making the sling tight enough to treat your leakage but not too tight so that you have long-term difficulty emptying your bladder. A mesh sling will not directly treat overactive bladder/urge incontinence and may worsen it.  There is an FDA safety notification on vaginal mesh procedures for prolapse but NOT mesh slings. We have extensive experience and training with mesh placement and we have close postoperative follow up to identify any potential complications from mesh. It is important to realize that this mesh is a permanent implant that cannot be easily removed. There are rare risks of mesh exposure (2-4%), pain with intercourse (0-7%), and infection (<1%). The risk of mesh exposure if more likely in a woman with risks for poor healing (prior radiation, poorly controlled diabetes, or immunocompromised). The risk of new or worsened chronic pain after mesh implant is more common in women with baseline chronic pain and/or poorly controlled anxiety or depression. Approximately 2-4% of patients will experience longer-term post-operative voiding dysfunction that may require surgical revision of the sling. We also reviewed that postoperatively, her stream may not be as strong as before surgery.   Prolapse (with or without mesh): Risk factors  for surgical failure  include things that put pressure on your pelvis and the surgical repair, including obesity, chronic cough, and heavy lifting or straining (including lifting children or adults, straining on the toilet, or lifting heavy objects such as furniture or anything weighing >25 lbs. Risks of recurrence is 20-30% with vaginal native tissue repair and a less than 10% with sacrocolpopexy with mesh.     Sacrocolpopexy: Mesh implants may provide more prolapse support, but do have some unique risks to consider. It is important to understand that mesh is permanent and cannot be easily removed. Risks of abdominal sacrocolpopexy mesh include mesh exposure (~3-6%), painful intercourse (recent studies show lower rates after surgery compared to before, with ~5-8% risk of new onset), and very rare risks of bowel or bladder injury or infection (<1%). The risk of mesh exposure is more likely in a woman with risks for poor healing (prior radiation, poorly controlled diabetes, or immunocompromised). The risk of new or worsened chronic pain after mesh implant is more common in women with baseline chronic pain and/or poorly controlled anxiety or depression. There is an FDA safety notification on vaginal mesh procedures for prolapse but NOT abdominal mesh procedures and therefore does not apply to your surgery. We have extensive experience and training with mesh placement and we have close postoperative follow up to identify any potential complications from mesh.    - For preop Visit:  She is required to have a visit within 30 days of her surgery.   Today we reviewed pre-operative preparation, peri-operative expectations, and post-operative instructions/recovery.  She was provided with instructional handouts. She understands not to take aspirin (>81mg ) or NSAIDs 7 days prior to surgery. Prescriptions provided for: Oxycodone 5mg  15 tabs, Ibuprofen 600mg  30 tabs, Tylenol 500mg  30 tabs, Miralax. These prescriptions will be sent prior to surgery.  - Medical clearance: not required  - Anticoagulant use: No - Medicaid Hysterectomy form: No - Accepts blood transfusion: Yes - Expected length of stay: outpatient  Request sent for surgery scheduling.   [name], MD  Time spent: I spent 35 minutes dedicated to the care of this patient on the date of this encounter to include pre-visit review of records,  face-to-face time with the patient discussing surgery and post visit documentation and ordering medication/ testing.

## 2021-06-03 ENCOUNTER — Encounter: Payer: Self-pay | Admitting: Obstetrics and Gynecology

## 2021-06-03 ENCOUNTER — Ambulatory Visit (INDEPENDENT_AMBULATORY_CARE_PROVIDER_SITE_OTHER): Payer: 59 | Admitting: Obstetrics and Gynecology

## 2021-06-03 ENCOUNTER — Other Ambulatory Visit: Payer: Self-pay

## 2021-06-03 VITALS — BP 158/76 | HR 76

## 2021-06-03 DIAGNOSIS — N393 Stress incontinence (female) (male): Secondary | ICD-10-CM | POA: Diagnosis not present

## 2021-06-03 DIAGNOSIS — N8110 Cystocele, unspecified: Secondary | ICD-10-CM | POA: Diagnosis not present

## 2021-06-03 DIAGNOSIS — N993 Prolapse of vaginal vault after hysterectomy: Secondary | ICD-10-CM

## 2021-06-03 MED ORDER — ACETAMINOPHEN 500 MG PO TABS: 500.0000 mg | ORAL_TABLET | Freq: Four times a day (QID) | ORAL | 0 refills | Status: AC | PRN

## 2021-06-03 MED ORDER — POLYETHYLENE GLYCOL 3350 17 GM/SCOOP PO POWD: 17.0000 g | Freq: Every day | ORAL | 0 refills | Status: AC

## 2021-06-03 MED ORDER — IBUPROFEN 600 MG PO TABS: 600.0000 mg | ORAL_TABLET | Freq: Four times a day (QID) | ORAL | 0 refills | Status: AC | PRN

## 2021-06-03 MED ORDER — OXYCODONE HCL 5 MG PO TABS: 5.0000 mg | ORAL_TABLET | ORAL | 0 refills | Status: AC | PRN

## 2021-06-18 ENCOUNTER — Other Ambulatory Visit: Payer: Self-pay

## 2021-06-18 ENCOUNTER — Encounter (HOSPITAL_BASED_OUTPATIENT_CLINIC_OR_DEPARTMENT_OTHER): Payer: Self-pay | Admitting: Obstetrics and Gynecology

## 2021-06-18 DIAGNOSIS — N993 Prolapse of vaginal vault after hysterectomy: Secondary | ICD-10-CM | POA: Diagnosis present

## 2021-06-18 NOTE — H&P (Signed)
[facility] Pre-Operative H&P  Subjective Chief Complaint: Latoya Anderson presents for a preoperative encounter.   History of Present Illness: Latoya Anderson is a 60 y.o. female who presents for preoperative visit.  She is scheduled to undergo Exam under anesthesia, Robotic assisted sacrocolpopexy, midurethral sling, cystoscopy on 06/23/21.  Her symptoms include recurrent vaginal bulge, and she was was found to have Stage II anterior, Stage I posterior, Stage I apical prolapse .   Urodynamic Impression:  1. Sensation was increased; capacity was reduced (137 ml) 2. Stress Incontinence was demonstrated at normal pressures; 3. Detrusor Overactivity was not demonstrated. 4. Emptying was normal with a normal PVR, a sustained detrusor contraction present,  abdominal straining present, dyssynergic urethral sphincter activity on EMG.    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   Family History  Problem Relation Age of Onset   Diabetes Mother    Hypertension Mother    Cancer Father        lung   Cancer Brother         throat    Social History   Tobacco Use   Smoking status: Never   Smokeless tobacco: Never  Vaping Use   Vaping Use: Never used  Substance Use Topics   Alcohol use: Never   Drug use: Never     Review of Systems was negative for a full 10 system review except as noted in the History of Present Illness.  No current facility-administered medications for this encounter.  Current Outpatient Medications:    Ascorbic Acid (VITAMIN C PO), Take by mouth., Disp: , Rfl:    CALCIUM PO, Take by mouth daily., Disp: , Rfl:    Cyanocobalamin (VITAMIN B 12 PO), Take by mouth as needed., Disp: , Rfl:    EPINEPHrine 0.3 mg/0.3 mL IJ SOAJ injection, Inject 0.3 mg into the muscle as needed for anaphylaxis., Disp: , Rfl:    OVER THE COUNTER MEDICATION, Vitamin b 6 prn, Disp: , Rfl:    Thiamine HCl (VITAMIN B-1 PO), Take by mouth as needed., Disp: , Rfl:    VITAMIN D PO, Take by mouth daily., Disp: , Rfl:    acetaminophen (TYLENOL) 500 MG tablet, Take 1 tablet (500 mg total) by mouth every 6 (six) hours as needed (pain). (Patient taking differently: Take 500 mg by mouth every 6 (six) hours as needed (pain). For after surgery), Disp: 30 tablet, Rfl:  0   estradiol (ESTRACE) 1 MG tablet, , Disp: , Rfl:    ibuprofen (ADVIL) 600 MG tablet, Take 1 tablet (600 mg total) by mouth every 6 (six) hours as needed. (Patient taking differently: Take 600 mg by mouth every 6 (six) hours as needed. For after surgery), Disp: 30 tablet, Rfl: 0   oxyCODONE (OXY IR/ROXICODONE) 5 MG immediate release tablet, Take 1 tablet (5 mg total) by mouth every 4 (four) hours as needed for severe pain. (Patient taking differently: Take 5 mg by mouth every 4 (four) hours as needed for severe pain. For after surgery), Disp: 15 tablet, Rfl: 0   polyethylene glycol powder (GLYCOLAX/MIRALAX) 17 GM/SCOOP powder, Take 17 g by mouth daily. Drink 17g (1 scoop) dissolved in water per day. (Patient taking differently: Take 17 g by mouth daily.  Drink 17g (1 scoop) dissolved in water per day. For after surgery), Disp: 255 g, Rfl: 0   Objective Gen: No apparent distress, A&O x 3.  Previous Pelvic Exam showed: POP-Q:    POP-Q   0                                            Aa   0                                           Ba   -5.5                                              C    3                                            Gh   3.5                                            Pb   9                                            tvl    -2                                            Ap   -2                                            Bp  D       Assessment/ Plan  Assessment: The patient is a 60 y.o. year old scheduled to undergo Exam under anesthesia, Robotic assisted sacrocolpopexy, midurethral sling, cystoscopy.    [name], MD

## 2021-06-18 NOTE — Progress Notes (Addendum)
Spoke w/ via phone for pre-op interview---pt Lab needs dos----  none per anesthesia surgery orders pending             Lab results------none COVID test -----patient states asymptomatic no test needed Arrive at -------530 am 06-23-2021 NPO after MN NO Solid Food.  Clear liquids from MN until---430 am Med rec completed Medications to take morning of surgery -----none Diabetic medication -----n/a Patient instructed no nail polish to be worn day of surgery Patient instructed to bring photo id and insurance card day of surgery Patient aware to have Driver (ride ) / caregiver    for 24 hours after surgery  spouse michael will stay Patient Special Instructions -----pt states she will be extended recovery, extended recovery instructions given Pre-Op special Istructions -----surgery orders req dr schroeder epic ib Patient verbalized understanding of instructions that were given at this phone interview. Patient denies shortness of breath, chest pain, fever, cough at this phone interview.

## 2021-06-22 NOTE — Anesthesia Preprocedure Evaluation (Addendum)
Anesthesia Evaluation  Patient identified by MRN, date of birth, ID band Patient awake    Reviewed: Allergy & Precautions, NPO status , Patient's Chart, lab work & pertinent test results  History of Anesthesia Complications (+) PONV and history of anesthetic complications  Airway Mallampati: II  TM Distance: >3 FB Neck ROM: Full    Dental no notable dental hx. (+) Dental Advisory Given, Teeth Intact   Pulmonary neg pulmonary ROS,    Pulmonary exam normal breath sounds clear to auscultation       Cardiovascular hypertension (some white coat HTN per pt, 140s SBP in preop- normally lower), Normal cardiovascular exam Rhythm:Regular Rate:Normal     Neuro/Psych negative neurological ROS  negative psych ROS   GI/Hepatic negative GI ROS, Neg liver ROS,   Endo/Other  negative endocrine ROS  Renal/GU negative Renal ROS Bladder dysfunction Female GU complaint     Musculoskeletal  (+) Arthritis , Osteoarthritis,    Abdominal   Peds  Hematology negative hematology ROS (+)   Anesthesia Other Findings   Reproductive/Obstetrics negative OB ROS                            Anesthesia Physical Anesthesia Plan  ASA: 1  Anesthesia Plan: General   Post-op Pain Management: Toradol IV (intra-op), Tylenol PO (pre-op) and Dilaudid IV   Induction: Intravenous  PONV Risk Score and Plan: 4 or greater and Ondansetron, Dexamethasone, Midazolam, Scopolamine patch - Pre-op, Treatment may vary due to age or medical condition, Diphenhydramine and Metaclopromide  Airway Management Planned: Oral ETT  Additional Equipment: None  Intra-op Plan:   Post-operative Plan: Extubation in OR  Informed Consent: I have reviewed the patients History and Physical, chart, labs and discussed the procedure including the risks, benefits and alternatives for the proposed anesthesia with the patient or authorized representative who  has indicated his/her understanding and acceptance.     Dental advisory given  Plan Discussed with: CRNA  Anesthesia Plan Comments:        Anesthesia Quick Evaluation

## 2021-06-23 ENCOUNTER — Ambulatory Visit (HOSPITAL_BASED_OUTPATIENT_CLINIC_OR_DEPARTMENT_OTHER)
Admission: RE | Admit: 2021-06-23 | Discharge: 2021-06-23 | Disposition: A | Discharge status: Home / Self Care | Payer: 59 | Attending: Obstetrics and Gynecology | Admitting: Obstetrics and Gynecology

## 2021-06-23 ENCOUNTER — Other Ambulatory Visit: Payer: Self-pay

## 2021-06-23 ENCOUNTER — Telehealth: Payer: Self-pay | Admitting: Obstetrics and Gynecology

## 2021-06-23 ENCOUNTER — Encounter (HOSPITAL_BASED_OUTPATIENT_CLINIC_OR_DEPARTMENT_OTHER): Payer: Self-pay | Admitting: Obstetrics and Gynecology

## 2021-06-23 ENCOUNTER — Ambulatory Visit (HOSPITAL_BASED_OUTPATIENT_CLINIC_OR_DEPARTMENT_OTHER): Payer: 59 | Admitting: Anesthesiology

## 2021-06-23 ENCOUNTER — Encounter (HOSPITAL_BASED_OUTPATIENT_CLINIC_OR_DEPARTMENT_OTHER)
Admission: RE | Disposition: A | Discharge status: Home / Self Care | Payer: Self-pay | Source: Home / Self Care | Attending: Obstetrics and Gynecology

## 2021-06-23 DIAGNOSIS — N993 Prolapse of vaginal vault after hysterectomy: Secondary | ICD-10-CM | POA: Insufficient documentation

## 2021-06-23 DIAGNOSIS — I10 Essential (primary) hypertension: Secondary | ICD-10-CM | POA: Insufficient documentation

## 2021-06-23 DIAGNOSIS — N393 Stress incontinence (female) (male): Secondary | ICD-10-CM | POA: Insufficient documentation

## 2021-06-23 DIAGNOSIS — M1990 Unspecified osteoarthritis, unspecified site: Secondary | ICD-10-CM | POA: Insufficient documentation

## 2021-06-23 DIAGNOSIS — Z90710 Acquired absence of both cervix and uterus: Secondary | ICD-10-CM | POA: Diagnosis not present

## 2021-06-23 HISTORY — PX: CYSTOSCOPY: SHX5120

## 2021-06-23 HISTORY — PX: BLADDER SUSPENSION: SHX72

## 2021-06-23 HISTORY — DX: Presence of spectacles and contact lenses: Z97.3

## 2021-06-23 HISTORY — DX: Stress incontinence (female) (male): N39.3

## 2021-06-23 HISTORY — DX: Nausea with vomiting, unspecified: R11.2

## 2021-06-23 HISTORY — DX: Other complications of anesthesia, initial encounter: T88.59XA

## 2021-06-23 HISTORY — PX: ROBOTIC ASSISTED LAPAROSCOPIC SACROCOLPOPEXY: SHX5388

## 2021-06-23 HISTORY — DX: Unspecified osteoarthritis, unspecified site: M19.90

## 2021-06-23 LAB — ABO/RH: ABO/RH(D): A POS

## 2021-06-23 LAB — TYPE AND SCREEN
ABO/RH(D): A POS
Antibody Screen: NEGATIVE

## 2021-06-23 SURGERY — SACROCOLPOPEXY, ROBOT-ASSISTED, LAPAROSCOPIC
Anesthesia: General

## 2021-06-23 MED ORDER — LIDOCAINE HCL (CARDIAC) PF 100 MG/5ML IV SOSY
PREFILLED_SYRINGE | INTRAVENOUS | Status: DC | PRN
  Administered 2021-06-23: 50 mg via INTRAVENOUS

## 2021-06-23 MED ORDER — ONDANSETRON HCL 4 MG/2ML IJ SOLN
INTRAMUSCULAR | Status: AC
  Filled 2021-06-23: qty 2

## 2021-06-23 MED ORDER — LACTATED RINGERS IV SOLN: INTRAVENOUS | Status: DC

## 2021-06-23 MED ORDER — KETOROLAC TROMETHAMINE 30 MG/ML IJ SOLN
INTRAMUSCULAR | Status: DC | PRN
  Administered 2021-06-23: 30 mg via INTRAVENOUS

## 2021-06-23 MED ORDER — PROPOFOL 10 MG/ML IV BOLUS
INTRAVENOUS | Status: DC | PRN
  Administered 2021-06-23: 120 mg via INTRAVENOUS
  Administered 2021-06-23: 80 mg via INTRAVENOUS

## 2021-06-23 MED ORDER — SODIUM CHLORIDE 0.9 % IR SOLN
Status: DC | PRN
  Administered 2021-06-23: 1000 mL via INTRAVESICAL
  Administered 2021-06-23: 1000 mL

## 2021-06-23 MED ORDER — MEPERIDINE HCL 25 MG/ML IJ SOLN: 6.2500 mg | INTRAMUSCULAR | Status: DC | PRN

## 2021-06-23 MED ORDER — DEXAMETHASONE SODIUM PHOSPHATE 10 MG/ML IJ SOLN
INTRAMUSCULAR | Status: AC
  Filled 2021-06-23: qty 1

## 2021-06-23 MED ORDER — KETOROLAC TROMETHAMINE 30 MG/ML IJ SOLN: 30.0000 mg | Freq: Once | INTRAMUSCULAR | Status: DC | PRN

## 2021-06-23 MED ORDER — IBUPROFEN 200 MG PO TABS: 600.0000 mg | ORAL_TABLET | Freq: Four times a day (QID) | ORAL | Status: DC

## 2021-06-23 MED ORDER — POVIDONE-IODINE 10 % EX SWAB: 2.0000 "application " | Freq: Once | CUTANEOUS | Status: DC

## 2021-06-23 MED ORDER — GABAPENTIN 300 MG PO CAPS
ORAL_CAPSULE | ORAL | Status: AC
  Filled 2021-06-23: qty 1

## 2021-06-23 MED ORDER — PHENYLEPHRINE HCL (PRESSORS) 10 MG/ML IV SOLN
INTRAVENOUS | Status: DC | PRN
  Administered 2021-06-23 (×4): 80 ug via INTRAVENOUS

## 2021-06-23 MED ORDER — ONDANSETRON HCL 4 MG/2ML IJ SOLN
INTRAMUSCULAR | Status: DC | PRN
  Administered 2021-06-23: 4 mg via INTRAVENOUS

## 2021-06-23 MED ORDER — LIDOCAINE 2% (20 MG/ML) 5 ML SYRINGE
INTRAMUSCULAR | Status: AC
  Filled 2021-06-23: qty 5

## 2021-06-23 MED ORDER — ROCURONIUM BROMIDE 100 MG/10ML IV SOLN
INTRAVENOUS | Status: DC | PRN
  Administered 2021-06-23 (×3): 10 mg via INTRAVENOUS
  Administered 2021-06-23: 60 mg via INTRAVENOUS

## 2021-06-23 MED ORDER — ACETAMINOPHEN 500 MG PO TABS
1000.0000 mg | ORAL_TABLET | Freq: Once | ORAL | Status: AC
  Administered 2021-06-23: 1000 mg via ORAL

## 2021-06-23 MED ORDER — ACETAMINOPHEN 500 MG PO TABS
ORAL_TABLET | ORAL | Status: AC
  Filled 2021-06-23: qty 2

## 2021-06-23 MED ORDER — PROPOFOL 10 MG/ML IV BOLUS
INTRAVENOUS | Status: AC
  Filled 2021-06-23: qty 20

## 2021-06-23 MED ORDER — OXYCODONE HCL 5 MG/5ML PO SOLN: 5.0000 mg | Freq: Once | ORAL | Status: DC | PRN

## 2021-06-23 MED ORDER — CLINDAMYCIN PHOSPHATE 900 MG/50ML IV SOLN
INTRAVENOUS | Status: AC
  Filled 2021-06-23: qty 50

## 2021-06-23 MED ORDER — AMISULPRIDE (ANTIEMETIC) 5 MG/2ML IV SOLN: 10.0000 mg | Freq: Once | INTRAVENOUS | Status: DC | PRN

## 2021-06-23 MED ORDER — SIMETHICONE 80 MG PO CHEW: 80.0000 mg | CHEWABLE_TABLET | Freq: Four times a day (QID) | ORAL | Status: DC | PRN

## 2021-06-23 MED ORDER — ROCURONIUM BROMIDE 10 MG/ML (PF) SYRINGE
PREFILLED_SYRINGE | INTRAVENOUS | Status: AC
  Filled 2021-06-23: qty 10

## 2021-06-23 MED ORDER — WATER FOR IRRIGATION, STERILE IR SOLN
Status: DC | PRN
  Administered 2021-06-23: 500 mL via SURGICAL_CAVITY

## 2021-06-23 MED ORDER — DEXAMETHASONE SODIUM PHOSPHATE 4 MG/ML IJ SOLN
INTRAMUSCULAR | Status: DC | PRN
  Administered 2021-06-23: 10 mg via INTRAVENOUS

## 2021-06-23 MED ORDER — OXYCODONE HCL 5 MG PO TABS: 5.0000 mg | ORAL_TABLET | Freq: Once | ORAL | Status: DC | PRN

## 2021-06-23 MED ORDER — ONDANSETRON HCL 4 MG/2ML IJ SOLN
4.0000 mg | Freq: Four times a day (QID) | INTRAMUSCULAR | Status: DC | PRN
  Administered 2021-06-23: 4 mg via INTRAVENOUS

## 2021-06-23 MED ORDER — MIDAZOLAM HCL 5 MG/5ML IJ SOLN
INTRAMUSCULAR | Status: DC | PRN
  Administered 2021-06-23 (×2): 1 mg via INTRAVENOUS

## 2021-06-23 MED ORDER — ACETAMINOPHEN 325 MG PO TABS
ORAL_TABLET | ORAL | Status: AC
  Filled 2021-06-23: qty 2

## 2021-06-23 MED ORDER — ONDANSETRON HCL 4 MG PO TABS: 4.0000 mg | ORAL_TABLET | Freq: Four times a day (QID) | ORAL | Status: DC | PRN

## 2021-06-23 MED ORDER — GABAPENTIN 300 MG PO CAPS
300.0000 mg | ORAL_CAPSULE | ORAL | Status: AC
  Administered 2021-06-23: 300 mg via ORAL

## 2021-06-23 MED ORDER — OXYCODONE HCL 5 MG PO TABS: 5.0000 mg | ORAL_TABLET | ORAL | Status: DC | PRN

## 2021-06-23 MED ORDER — MIDAZOLAM HCL 2 MG/2ML IJ SOLN
INTRAMUSCULAR | Status: AC
  Filled 2021-06-23: qty 2

## 2021-06-23 MED ORDER — PHENAZOPYRIDINE HCL 100 MG PO TABS
200.0000 mg | ORAL_TABLET | ORAL | Status: AC
  Administered 2021-06-23: 200 mg via ORAL

## 2021-06-23 MED ORDER — SUGAMMADEX SODIUM 200 MG/2ML IV SOLN
INTRAVENOUS | Status: DC | PRN
  Administered 2021-06-23: 200 mg via INTRAVENOUS

## 2021-06-23 MED ORDER — PROMETHAZINE HCL 25 MG/ML IJ SOLN: 6.2500 mg | INTRAMUSCULAR | Status: DC | PRN

## 2021-06-23 MED ORDER — CLINDAMYCIN PHOSPHATE 900 MG/50ML IV SOLN
900.0000 mg | INTRAVENOUS | Status: AC
  Administered 2021-06-23: 900 mg via INTRAVENOUS

## 2021-06-23 MED ORDER — PHENYLEPHRINE 40 MCG/ML (10ML) SYRINGE FOR IV PUSH (FOR BLOOD PRESSURE SUPPORT)
PREFILLED_SYRINGE | INTRAVENOUS | Status: AC
  Filled 2021-06-23: qty 10

## 2021-06-23 MED ORDER — PHENAZOPYRIDINE HCL 100 MG PO TABS
ORAL_TABLET | ORAL | Status: AC
  Filled 2021-06-23: qty 1

## 2021-06-23 MED ORDER — FENTANYL CITRATE (PF) 100 MCG/2ML IJ SOLN
INTRAMUSCULAR | Status: DC | PRN
  Administered 2021-06-23 (×3): 50 ug via INTRAVENOUS
  Administered 2021-06-23 (×2): 25 ug via INTRAVENOUS
  Administered 2021-06-23 (×2): 50 ug via INTRAVENOUS

## 2021-06-23 MED ORDER — FENTANYL CITRATE (PF) 100 MCG/2ML IJ SOLN
INTRAMUSCULAR | Status: AC
  Filled 2021-06-23: qty 2

## 2021-06-23 MED ORDER — KETOROLAC TROMETHAMINE 30 MG/ML IJ SOLN
INTRAMUSCULAR | Status: AC
  Filled 2021-06-23: qty 1

## 2021-06-23 MED ORDER — HEMOSTATIC AGENTS (NO CHARGE) OPTIME
TOPICAL | Status: DC | PRN
  Administered 2021-06-23: 1 via TOPICAL

## 2021-06-23 MED ORDER — GENTAMICIN SULFATE 40 MG/ML IJ SOLN
5.0000 mg/kg | INTRAVENOUS | Status: AC
  Administered 2021-06-23: 258.4 mg via INTRAVENOUS
  Filled 2021-06-23: qty 6.5

## 2021-06-23 MED ORDER — ACETAMINOPHEN 500 MG PO TABS: 1000.0000 mg | ORAL_TABLET | ORAL | Status: DC

## 2021-06-23 MED ORDER — SCOPOLAMINE 1 MG/3DAYS TD PT72
MEDICATED_PATCH | TRANSDERMAL | Status: AC
  Filled 2021-06-23: qty 1

## 2021-06-23 MED ORDER — LIDOCAINE-EPINEPHRINE 1 %-1:100000 IJ SOLN
INTRAMUSCULAR | Status: DC | PRN
  Administered 2021-06-23: 10 mL

## 2021-06-23 MED ORDER — FENTANYL CITRATE (PF) 250 MCG/5ML IJ SOLN
INTRAMUSCULAR | Status: AC
  Filled 2021-06-23: qty 5

## 2021-06-23 MED ORDER — ACETAMINOPHEN 325 MG PO TABS
650.0000 mg | ORAL_TABLET | ORAL | Status: DC | PRN
  Administered 2021-06-23: 650 mg via ORAL

## 2021-06-23 MED ORDER — SCOPOLAMINE 1 MG/3DAYS TD PT72
1.0000 | MEDICATED_PATCH | TRANSDERMAL | Status: DC
  Administered 2021-06-23: 1.5 mg via TRANSDERMAL

## 2021-06-23 MED ORDER — HYDROMORPHONE HCL 1 MG/ML IJ SOLN
0.2500 mg | INTRAMUSCULAR | Status: DC | PRN
Start: 2021-06-23 — End: 2021-06-23

## 2021-06-23 SURGICAL SUPPLY — 87 items
ADH SKN CLS APL DERMABOND .7 (GAUZE/BANDAGES/DRESSINGS) ×1
AGENT HMST KT MTR STRL THRMB (HEMOSTASIS)
APL ESCP 34 STRL LF DISP (HEMOSTASIS)
APL PRP STRL LF DISP 70% ISPRP (MISCELLANEOUS) ×1
APL SRG 38 LTWT LNG FL B (MISCELLANEOUS) ×1
APPLICATOR ARISTA FLEXITIP XL (MISCELLANEOUS) ×2 IMPLANT
APPLICATOR SURGIFLO ENDO (HEMOSTASIS) IMPLANT
BLADE CLIPPER SENSICLIP SURGIC (BLADE) ×3 IMPLANT
BLADE SURG 15 STRL LF DISP TIS (BLADE) ×1 IMPLANT
BLADE SURG 15 STRL SS (BLADE) ×3
CATH FOLEY 3WAY  5CC 16FR (CATHETERS) ×3
CATH FOLEY 3WAY 5CC 16FR (CATHETERS) ×1 IMPLANT
CHLORAPREP W/TINT 26 (MISCELLANEOUS) ×3 IMPLANT
COVER BACK TABLE 60X90IN (DRAPES) ×3 IMPLANT
COVER TIP SHEARS 8 DVNC (MISCELLANEOUS) ×1 IMPLANT
COVER TIP SHEARS 8MM DA VINCI (MISCELLANEOUS) ×3
DECANTER SPIKE VIAL GLASS SM (MISCELLANEOUS) ×6 IMPLANT
DEFOGGER SCOPE WARMER CLEARIFY (MISCELLANEOUS) ×3 IMPLANT
DERMABOND ADVANCED (GAUZE/BANDAGES/DRESSINGS) ×2
DERMABOND ADVANCED .7 DNX12 (GAUZE/BANDAGES/DRESSINGS) ×1 IMPLANT
DEVICE CAPIO SLIM SINGLE (INSTRUMENTS) IMPLANT
DRAPE ARM DVNC X/XI (DISPOSABLE) ×4 IMPLANT
DRAPE COLUMN DVNC XI (DISPOSABLE) ×1 IMPLANT
DRAPE DA VINCI XI ARM (DISPOSABLE) ×12
DRAPE DA VINCI XI COLUMN (DISPOSABLE) ×3
DRAPE SHEET LG 3/4 BI-LAMINATE (DRAPES) ×2 IMPLANT
DRAPE UTILITY XL STRL (DRAPES) ×3 IMPLANT
ELECT REM PT RETURN 9FT ADLT (ELECTROSURGICAL) ×3
ELECTRODE REM PT RTRN 9FT ADLT (ELECTROSURGICAL) ×1 IMPLANT
GAUZE 4X4 16PLY ~~LOC~~+RFID DBL (SPONGE) ×6 IMPLANT
GLOVE SURG ENC MOIS LTX SZ6 (GLOVE) ×12 IMPLANT
GLOVE SURG UNDER POLY LF SZ6.5 (GLOVE) ×15 IMPLANT
GOWN STRL REUS W/TWL LRG LVL3 (GOWN DISPOSABLE) ×3 IMPLANT
HEMOSTAT ARISTA ABSORB 3G PWDR (HEMOSTASIS) ×2 IMPLANT
HIBICLENS CHG 4% 4OZ BTL (MISCELLANEOUS) ×3 IMPLANT
HOLDER FOLEY CATH W/STRAP (MISCELLANEOUS) ×3 IMPLANT
IRRIG SUCT STRYKERFLOW 2 WTIP (MISCELLANEOUS) ×3
IRRIGATION SUCT STRKRFLW 2 WTP (MISCELLANEOUS) ×1 IMPLANT
KIT TURNOVER CYSTO (KITS) ×3 IMPLANT
LEGGING LITHOTOMY PAIR STRL (DRAPES) ×3 IMPLANT
MANIFOLD NEPTUNE II (INSTRUMENTS) ×3 IMPLANT
MANIPULATOR ADVINCU DEL 2.5 PL (MISCELLANEOUS) IMPLANT
MANIPULATOR ADVINCU DEL 3.0 PL (MISCELLANEOUS) IMPLANT
MANIPULATOR ADVINCU DEL 3.5 PL (MISCELLANEOUS) IMPLANT
MANIPULATOR ADVINCU DEL 4.0 PL (MISCELLANEOUS) IMPLANT
MESH VERTESSA LITE -Y 2X4X3 (Mesh General) ×2 IMPLANT
NEEDLE HYPO 22GX1.5 SAFETY (NEEDLE) ×3 IMPLANT
NEEDLE INSUFFLATION 120MM (ENDOMECHANICALS) ×3 IMPLANT
NS IRRIG 1000ML POUR BTL (IV SOLUTION) ×3 IMPLANT
OBTURATOR OPTICAL STANDARD 8MM (TROCAR) ×3
OBTURATOR OPTICAL STND 8 DVNC (TROCAR) ×1
OBTURATOR OPTICALSTD 8 DVNC (TROCAR) ×1 IMPLANT
PACK CYSTO (CUSTOM PROCEDURE TRAY) ×3 IMPLANT
PACK ROBOT WH (CUSTOM PROCEDURE TRAY) ×3 IMPLANT
PACK ROBOTIC GOWN (GOWN DISPOSABLE) ×3 IMPLANT
PACK VAGINAL WOMENS (CUSTOM PROCEDURE TRAY) ×3 IMPLANT
PAD OB MATERNITY 4.3X12.25 (PERSONAL CARE ITEMS) ×3 IMPLANT
PAD POSITIONING PINK XL (MISCELLANEOUS) ×3 IMPLANT
PAD PREP 24X48 CUFFED NSTRL (MISCELLANEOUS) ×3 IMPLANT
POUCH LAPAROSCOPIC INSTRUMENT (MISCELLANEOUS) IMPLANT
PROTECTOR NERVE ULNAR (MISCELLANEOUS) ×3 IMPLANT
RETRACTOR LONE STAR DISPOSABLE (INSTRUMENTS) ×3 IMPLANT
RETRACTOR STAY HOOK 5MM (MISCELLANEOUS) ×3 IMPLANT
SEAL CANN UNIV 5-8 DVNC XI (MISCELLANEOUS) ×4 IMPLANT
SEAL XI 5MM-8MM UNIVERSAL (MISCELLANEOUS) ×12
SET IRRIG Y TYPE TUR BLADDER L (SET/KITS/TRAYS/PACK) ×3 IMPLANT
SET TUBE SMOKE EVAC HIGH FLOW (TUBING) ×3 IMPLANT
SPONGE T-LAP 4X18 ~~LOC~~+RFID (SPONGE) IMPLANT
SUCTION FRAZIER HANDLE 10FR (MISCELLANEOUS) ×3
SUCTION TUBE FRAZIER 10FR DISP (MISCELLANEOUS) ×1 IMPLANT
SURGIFLO W/THROMBIN 8M KIT (HEMOSTASIS) IMPLANT
SUT ABS MONO DBL WITH NDL 48IN (SUTURE) IMPLANT
SUT CV-0 GORETEX TFX25 36 (SUTURE) IMPLANT
SUT GORETEX NAB #0 THX26 36IN (SUTURE) ×2 IMPLANT
SUT MNCRL AB 4-0 PS2 18 (SUTURE) ×3 IMPLANT
SUT MON AB 2-0 SH 27 (SUTURE) ×3 IMPLANT
SUT VIC AB 0 CT1 27 (SUTURE)
SUT VIC AB 0 CT1 27XBRD ANTBC (SUTURE) IMPLANT
SUT VIC AB 2-0 SH 27 (SUTURE) ×3
SUT VIC AB 2-0 SH 27XBRD (SUTURE) ×1 IMPLANT
SUT VICRYL 2-0 SH 8X27 (SUTURE) IMPLANT
SUT VLOC 180 2-0 9IN GS21 (SUTURE) ×6 IMPLANT
SYR BULB EAR ULCER 3OZ GRN STR (SYRINGE) ×3 IMPLANT
SYS SLING ADV FIT BLUE TRNSVAG (Sling) ×2 IMPLANT
TOWEL OR 17X26 10 PK STRL BLUE (TOWEL DISPOSABLE) ×3 IMPLANT
TRAY FOLEY W/BAG SLVR 14FR (SET/KITS/TRAYS/PACK) ×3 IMPLANT
TROCAR XCEL NON BLADE 8MM B8LT (ENDOMECHANICALS) ×3 IMPLANT

## 2021-06-23 NOTE — Anesthesia Postprocedure Evaluation (Signed)
Anesthesia Post Note  Patient: Latoya Anderson  Procedure(s) Performed: XI ROBOTIC ASSISTED LAPAROSCOPIC SACROCOLPOPEXY TRANSVAGINAL TAPE (TVT) PROCEDURE CYSTOSCOPY     Patient location during evaluation: PACU Anesthesia Type: General Level of consciousness: awake and alert, oriented and patient cooperative Pain management: pain level controlled Vital Signs Assessment: post-procedure vital signs reviewed and stable Respiratory status: spontaneous breathing, nonlabored ventilation and respiratory function stable Cardiovascular status: blood pressure returned to baseline and stable Postop Assessment: no apparent nausea or vomiting Anesthetic complications: no   No notable events documented.  Last Vitals:  Vitals:   06/23/21 1037 06/23/21 1045  BP: 127/70   Pulse: 77   Resp: 13   Temp: (!) 35.4 C (!) 35.7 C  SpO2: 100%     Last Pain:  Vitals:   06/23/21 1037  TempSrc:   PainSc: [place]

## 2021-06-23 NOTE — Progress Notes (Signed)
Received pt from PACU. Upon assessment, pt noted with red, raised, hive-like rash to perineal and vaginal area extending to gluteal folds. Rash also extended to lower abdominal area. Pt denies burning or itching; however pt is extremely drowsy at this time. VSS. Dr. Wannetta Sender notified by PACU RN, will come to bedside to assess. Will continue to monitor.

## 2021-06-23 NOTE — Discharge Instructions (Signed)

## 2021-06-23 NOTE — Telephone Encounter (Signed)
Latoya Anderson underwent Robotic sacrocolpopexy, TVT sling, cystoscopy on 06/23/21.   She passed her voiding trial.  352ml was backfilled into the bladder Voided 327ml  PVR by bladder scan was 12ml.   She was discharged without a catheter. Please call her for a routine post op check. Thanks!  [name], MD

## 2021-06-23 NOTE — Anesthesia Procedure Notes (Signed)
Procedure Name: Intubation Date/Time: 06/23/2021 7:45 AM Performed by: Justice Rocher, CRNA Pre-anesthesia Checklist: Patient identified, Emergency Drugs available, Suction available, Patient being monitored and Timeout performed Patient Re-evaluated:Patient Re-evaluated prior to induction Oxygen Delivery Method: Circle system utilized Preoxygenation: Pre-oxygenation with 100% oxygen Induction Type: IV induction Ventilation: Mask ventilation without difficulty Laryngoscope Size: Mac and 3 Grade View: Grade I Tube type: Oral Tube size: 7.0 mm Number of attempts: 1 Airway Equipment and Method: Stylet and Oral airway Placement Confirmation: ETT inserted through vocal cords under direct vision, positive ETCO2 and breath sounds checked- equal and bilateral Secured at: 19 cm Tube secured with: Tape Dental Injury: Teeth and Oropharynx as per pre-operative assessment

## 2021-06-23 NOTE — Interval H&P Note (Signed)
History and Physical Interval Note:  06/23/2021 7:13 AM  Latoya Anderson  has presented today for surgery, with the diagnosis of vaginal vault prolapse; anterior vaginal prolapse.  The various methods of treatment have been discussed with the patient and family. After consideration of risks, benefits and other options for treatment, the patient has consented to  Procedure(s): XI ROBOTIC ASSISTED LAPAROSCOPIC SACROCOLPOPEXY (N/A) TRANSVAGINAL TAPE (TVT) PROCEDURE (N/A) CYSTOSCOPY (N/A) as a surgical intervention.   Vitals:   06/23/21 0543  BP: (!) 143/66  Pulse: 80  Resp: 15  Temp: 97.8 F (36.6 C)  SpO2: 97%    Gen: NAD Lungs: normal respirations Abd: soft, nontender No LE edema   The patient's history has been reviewed, patient examined, no change in status, stable for surgery.  I have reviewed the patient's chart and labs.  Questions were answered to the patient's satisfaction.     [name]

## 2021-06-23 NOTE — Op Note (Signed)
Operative Note  Preoperative Diagnosis: anterior vaginal prolapse, posterior vaginal prolapse, and vaginal vault prolapse after hysterectomy  Postoperative Diagnosis: same  Procedures performed:  Robotic assisted sacrocolpopexy, midurethral sling, cystoscopy  Implants:  Implant Name Type Inv. Item Serial No. Manufacturer Lot No. LRB No. Used Action  MESH Valli Glance [id number] [employer] MESH [place] [id number] N/A 1 Implanted  SYS SLING ADV FIT BLUE TRNSVAG - [id number] Sling SYS SLING ADV FIT BLUE TRNSVAG  [place] [id number] N/A 1 Implanted    Attending Surgeon: Sherlene Shams, MD   Anesthesia: General endotracheal  Findings: 1. On vaginal exam, stage II pelvic organ prolapse noted  2. On laparoscopy, small adhesions of bowel to vaginal vault.    3. On cystoscopy, normal bladder and urethra without injury, lesion or foreign body. Brisk bilateral ureteral efflux noted.   Specimens: none  Estimated blood loss: 100 mL  IV fluids: 1500 mL  Urine output: 263 mL  Complications: none  Procedure in Detail:  After informed consent was obtained, the patient was taken to the operating room, where general anesthesia was induced and found to be adequate. She was placed in dorsolithotomy position in yellowfin stirrups. Her hips were noted not to be hyperflexed or hyperextended. Her arms were padded with gel pads and tucked to her sides. A padded strap was placed across her chest with foam between the pad and her skin. She was noted to be appropriately positioned with all pressure points well padded and off tension. A tilt test showed no slippage. She was prepped and draped in the usual sterile fashion.  A sterile Foley catheter was inserted.   0.25% plain Marcaine was injected at the umbilicus and an incision was made with a scalpel. A Veress needle was inserted into the incision, CO2 insufflation was started, a low opening pressure was noted,  and pneumoperitoneum was obtained. The Veress needle was removed and an 18mm robotic trocar was placed with direct visualization. Entry into the peritoneal cavity was confirmed.  After determining placement for the other ports, Local anesthetic was injected at each site and two 8 mm incisions were made for robotic ports at 10 cm lateral to and at the level of the umbilical port. Two additional 8 mm incisions were made 10 cm lateral to these and 30 degrees down followed by 8 mm robotic ports - the right side for an assistant port. All trocars were placed sequentially under direct visualization of the camera. The patient was placed in Trendelenburg. The sacrum appeared to be free of any adhesive disease.The robot was docked on the patient's right side. Monopolar endoshears were placed in the right arm, a [place] bipolar grasper was placed in the 2nd arm of the patient's left side, and a Tip up grasper was placed in the 3rd arm on the patient's left side.    Adhesions were noted of the left bowel to the vaginal cuff and these were removed with sharp dissection. With a lucite probe in the vagina, anterior vaginal dissection was then performed with sharp dissection and electrosurgery to separate the vesicovaginal space to the level of the urethra. The posterior vaginal dissection was then performed with sharp dissection and electrosurgery in order to dissect the rectum away from the posterior vagina. The  Attention was then turned to the sacral promontory. The peritoneum overlying the sacral promontory was tented up, dissected sharply with monopolar scissors and electrosurgery using layer by layer technique. The overlying areolar and  adipose tissue were taken down until the anterior longitudinal sacral ligament was identified. The peritoneal incision was extended down to the posterior cul-de-sac. This was performed with care to avoid the ureter on the right side and the sigmoid colon and its mesentary on the left side.    Small vessels were cauterized along the way to obtain excellent hemostasis. A "Y" mesh was then inserted into the abdomen after trimming to appropriate size. With the probe in the vagina, the anterior leaf of the Y mesh was affixed to the anterior portion of the vagina using a 2-0 v-loc suture in a spiral pattern to distribute the suture evenly across the surface of the anterior mesh leaf. In a similar fashion, the posterior leaf of the Y mesh was attached to the posterior surface of the vagina with 2-0 v-loc suture.  The distal end of the mesh was then brought to overlie the sacrum. The correct amount of tension was determined in order to elevate the vagina, but not put the mesh under tension. The distal end of the mesh was then affixed to the anterior longitudinal sacral ligament using two interrupted transverse stitches of CV2Gortex. The excess distal mesh was then cut and removed. Arista was place over the vagina and peritoneal incision. Good hemostasis was noted. The peritoneum was reapproximated over the mesh using 2-0 monocryl. The bladder flap was incorporated to completely retroperitonealize the mesh. All pedicles were carefully inspected and noted to be hemostatic as the CO2 gas was deflated. All instruments were removed from the patient's abdomen.    The Foley catheter was removed.  A 70-degree cystoscope was introduced, and 360-degree inspection revealed no injury, lesion or foreign body in the bladder. Brisk bilateral ureteral efflux was noted with the assistance of pyridium.  The bladder was drained and the cystoscope was removed.  The Foley catheter was replaced. The robot was undocked. The CO2 gas was removed and the ports were removed.  The skin incisions were closed with subcutaneous stitches of 4-0 Monocryl and covered with skin glue.    Attention was turned to the midurethral sling.   A lonestar self-retraining retractor was placed with 4 stay hooks. The mid urethral area was located on the  anterior vaginal wall.  Two Allis clamps were placed at the level of the midurethra. 1% lidocaine with epinephrine was injected into the vaginal mucosa. A vertical incision was made between the two clamps using a 15-blade scalpel.  Using sharp dissection, Metzenbaum scissors were used to make a periurethral tunnel from the vaginal incision towards the pubic rami bilaterally for the future sling tracts. The bladder was ensured to be empty. The trocar and attached sling were introduced into the right side of the periurethral vaginal incision, just inferior to the pubic symphysis on the right side. The trocar was guided through the endopelvic fascia and directly vertically.  While hugging the cephalad surface of the pubic bone, the trocar was guided out through the abdomen 2 fingerbreadths lateral to midline at the level of the pubic symphysis on the ipsilateral side. The trocar was placed on the left side in a similar fashion.  A 70-degree cystoscope was introduced, and 360-degree inspection revealed no trauma or trocars in the bladder, with brisk bilateral ureteral efflux.  The bladder was drained and the cystoscope was removed.  The Foley catheter was reinserted.  The sling was brought to lie beneath the mid-urethra.  A needle driver was placed behind the sling to ensure no tension.  The trocars  were removed.  The plastic sheath was removed from the sling and the distal ends of the sling were trimmed just below the level of the skin incisions. Hemostasis was noted.  Tension-free positioning of the sling was confirmed. Vaginal inspection revealed no vaginotomy or sling perforations of the mucosa. Surgiflo was placed into the incision to help obtain hemostasis and good hemostasis was noted. The vaginal mucosal edges were reapproximated using 2-0 Vicryl.  The vagina was copiously irrigated.  Hemostasis was again noted. Vaginal packing wan not placed. The suprapubic sling incisions were closed with Dermabond. The  patient tolerated the procedure well.  She was awakened from anesthesia and transferred to the recovery room in stable condition. All needle and sponge counts were correct x 2.    [name], MD

## 2021-06-23 NOTE — Progress Notes (Signed)
Per MD order voiding trial completed. 250 ml NS instilled via urinary catheter, catheter removed and pt immediately voided 300 ml clear/yellow urine. Bladder scan revealed 0 ml PVR. Pt had 200 ml yellow/bile emesis upon ambulating back to bed, prn IV zofran given, VSS. Dr. Wannetta Sender notified, orders obtained to D/C pt home.

## 2021-06-23 NOTE — Transfer of Care (Signed)
Immediate Anesthesia Transfer of Care Note  Patient: Latoya Anderson  Procedure(s) Performed: Procedure(s) (LRB): XI ROBOTIC ASSISTED LAPAROSCOPIC SACROCOLPOPEXY (N/A) TRANSVAGINAL TAPE (TVT) PROCEDURE (N/A) CYSTOSCOPY (N/A)  Patient Location: PACU  Anesthesia Type: General  Level of Consciousness: awake, sedated, patient cooperative and responds to stimulation  Airway & Oxygen Therapy: Patient Spontanous Breathing and Patient connected to [place] 02 and soft FM   Post-op Assessment: Report given to PACU RN, Post -op Vital signs reviewed and stable and Patient moving all extremities  Post vital signs: Reviewed and stable  Complications: No apparent anesthesia complications

## 2021-06-24 ENCOUNTER — Encounter (HOSPITAL_BASED_OUTPATIENT_CLINIC_OR_DEPARTMENT_OTHER): Payer: Self-pay | Admitting: Obstetrics and Gynecology

## 2021-06-24 NOTE — Telephone Encounter (Signed)
Post- Op Call  Latoya Anderson underwent Robotic sacrocolpopexy, TVT sling, cystoscopy on 06/23/21 with Dr Wannetta Sender. The patient reports that her pain is controlled. She is taking Tylenol. She has very minimal vaginal bleeding. She has not had a bowel movement and is taking Miralax for a bowel regimen. She was discharged without a catheter.   Elita Quick, CMA

## 2021-06-25 ENCOUNTER — Encounter (HOSPITAL_BASED_OUTPATIENT_CLINIC_OR_DEPARTMENT_OTHER): Payer: Self-pay | Admitting: *Deleted

## 2021-07-08 ENCOUNTER — Telehealth: Payer: Self-pay | Admitting: Obstetrics and Gynecology

## 2021-07-09 NOTE — Telephone Encounter (Signed)
Having slight discharge, yellow and malodorous. About 2 weeks out from RA sacrocolpopexy with sling. Has some right sided pain. No problems with bowel or urine. Notes no fever, chills. Offered office visit with me to see about any infection, she declines. She has warning and return precautions. Donnamae Jude, MD 07/09/2021 8:19 AM

## 2021-08-03 NOTE — Progress Notes (Signed)
[facility]  Date of Visit: 08/04/2021  History of Present Illness: Ms. Latoya Anderson is a 61 y.o. female scheduled today for a post-operative visit.   Surgery: s/p Robotic assisted sacrocolpopexy, midurethral sling, cystoscopy on 06/23/21  She passed her postoperative void trial.   Postoperative course has been uncomplicated.   Today she reports she has been doing well since the surgery.   UTI in the last 6 weeks? No  Pain? No  She has returned to her normal activity (except for postop restrictions) Vaginal bulge? No  Stress incontinence: occasionally when she does a deep squat she will leak but overall has greatly improved.  Urgency/frequency: No  Urge incontinence: No  Voiding dysfunction: No  Bowel issues: No   Subjective Success: Do you usually have a bulge or something falling out that you can see or feel in the vaginal area? No  Retreatment Success: Any retreatment with surgery or pessary for any compartment? No    Medications: She has a current medication list which includes the following prescription(s): acetaminophen, ascorbic acid, calcium, cyanocobalamin, epinephrine, estradiol, ibuprofen, OVER THE COUNTER MEDICATION, oxycodone, polyethylene glycol powder, thiamine hcl, and vitamin d.   Allergies: Patient is allergic to alpha-gal, galactose, penicillins, sulfa antibiotics, and trimethoprim.   Physical Exam: BP (!) 157/76    Pulse 70   Abdomen: soft, non-tender, without masses or organomegaly Laparoscopic Incisions: healing well.  Pelvic Examination: Vagina: Normal mucosa, no sutures present. No tenderness along the anterior or posterior vagina. No apical tenderness. No pelvic masses. No visible or palpable mesh.  POP-Q: POP-Q  -2.5                                            Aa   -2.5                                           Ba  -8                                              C   3                                            Gh  3.5                                             Pb  9                                            tvl   -2                                            Ap  -2  Bp                                                 D    ---------------------------------------------------------  Assessment and Plan:  1. Post-operative state     - Well healed after surgery - The patient was given a copy of her operative note for her records - Can resume regular activity including exercise, swimming and intercourse,  if desired.  - Discussed avoidance of heavy lifting and straining long term to reduce the risk of recurrence.   Follow up as needed  [name], MD

## 2021-08-04 ENCOUNTER — Other Ambulatory Visit: Payer: Self-pay

## 2021-08-04 ENCOUNTER — Encounter: Payer: Self-pay | Admitting: Obstetrics and Gynecology

## 2021-08-04 ENCOUNTER — Ambulatory Visit (INDEPENDENT_AMBULATORY_CARE_PROVIDER_SITE_OTHER): Payer: 59 | Admitting: Obstetrics and Gynecology

## 2021-08-04 VITALS — BP 157/76 | HR 70

## 2021-08-04 DIAGNOSIS — Z9889 Other specified postprocedural states: Secondary | ICD-10-CM

## 2022-01-19 ENCOUNTER — Other Ambulatory Visit: Payer: Self-pay | Admitting: Nurse Practitioner

## 2022-01-19 DIAGNOSIS — R928 Other abnormal and inconclusive findings on diagnostic imaging of breast: Secondary | ICD-10-CM

## 2022-02-17 DIAGNOSIS — D225 Melanocytic nevi of trunk: Secondary | ICD-10-CM | POA: Diagnosis not present

## 2022-02-17 DIAGNOSIS — L814 Other melanin hyperpigmentation: Secondary | ICD-10-CM | POA: Diagnosis not present

## 2022-02-17 DIAGNOSIS — D2239 Melanocytic nevi of other parts of face: Secondary | ICD-10-CM | POA: Diagnosis not present

## 2022-02-17 DIAGNOSIS — L578 Other skin changes due to chronic exposure to nonionizing radiation: Secondary | ICD-10-CM | POA: Diagnosis not present

## 2022-02-24 ENCOUNTER — Ambulatory Visit
Admission: RE | Admit: 2022-02-24 | Discharge: 2022-02-24 | Disposition: A | Discharge status: Home / Self Care | Payer: Medicare HMO | Source: Ambulatory Visit | Attending: Nurse Practitioner | Admitting: Nurse Practitioner

## 2022-02-24 DIAGNOSIS — R928 Other abnormal and inconclusive findings on diagnostic imaging of breast: Secondary | ICD-10-CM

## 2022-03-02 DIAGNOSIS — Z780 Asymptomatic menopausal state: Secondary | ICD-10-CM | POA: Diagnosis not present

## 2022-03-02 DIAGNOSIS — Z1331 Encounter for screening for depression: Secondary | ICD-10-CM | POA: Diagnosis not present

## 2022-03-02 DIAGNOSIS — Z0000 Encounter for general adult medical examination without abnormal findings: Secondary | ICD-10-CM | POA: Diagnosis not present

## 2022-03-02 DIAGNOSIS — Z6820 Body mass index (BMI) 20.0-20.9, adult: Secondary | ICD-10-CM | POA: Diagnosis not present

## 2022-03-02 DIAGNOSIS — E785 Hyperlipidemia, unspecified: Secondary | ICD-10-CM | POA: Diagnosis not present

## 2022-05-03 ENCOUNTER — Encounter: Payer: Self-pay | Admitting: *Deleted

## 2022-08-02 DIAGNOSIS — E538 Deficiency of other specified B group vitamins: Secondary | ICD-10-CM | POA: Diagnosis not present

## 2022-08-02 DIAGNOSIS — E559 Vitamin D deficiency, unspecified: Secondary | ICD-10-CM | POA: Diagnosis not present

## 2022-08-02 DIAGNOSIS — E785 Hyperlipidemia, unspecified: Secondary | ICD-10-CM | POA: Diagnosis not present

## 2022-08-03 ENCOUNTER — Other Ambulatory Visit: Payer: Self-pay | Admitting: Nurse Practitioner

## 2022-08-03 DIAGNOSIS — N6092 Unspecified benign mammary dysplasia of left breast: Secondary | ICD-10-CM

## 2022-11-15 DIAGNOSIS — H524 Presbyopia: Secondary | ICD-10-CM | POA: Diagnosis not present

## 2023-01-18 DIAGNOSIS — Z780 Asymptomatic menopausal state: Secondary | ICD-10-CM | POA: Diagnosis not present

## 2023-01-18 DIAGNOSIS — M8580 Other specified disorders of bone density and structure, unspecified site: Secondary | ICD-10-CM | POA: Diagnosis not present

## 2023-01-18 DIAGNOSIS — E559 Vitamin D deficiency, unspecified: Secondary | ICD-10-CM | POA: Diagnosis not present

## 2023-01-18 DIAGNOSIS — N6092 Unspecified benign mammary dysplasia of left breast: Secondary | ICD-10-CM | POA: Diagnosis not present

## 2023-01-18 DIAGNOSIS — Z6820 Body mass index (BMI) 20.0-20.9, adult: Secondary | ICD-10-CM | POA: Diagnosis not present

## 2023-01-18 DIAGNOSIS — E785 Hyperlipidemia, unspecified: Secondary | ICD-10-CM | POA: Diagnosis not present

## 2023-01-18 DIAGNOSIS — N959 Unspecified menopausal and perimenopausal disorder: Secondary | ICD-10-CM | POA: Diagnosis not present

## 2023-01-18 DIAGNOSIS — E538 Deficiency of other specified B group vitamins: Secondary | ICD-10-CM | POA: Diagnosis not present

## 2023-01-24 ENCOUNTER — Encounter: Payer: Self-pay | Admitting: Nurse Practitioner

## 2023-01-24 DIAGNOSIS — N63 Unspecified lump in unspecified breast: Secondary | ICD-10-CM

## 2023-02-16 DIAGNOSIS — N959 Unspecified menopausal and perimenopausal disorder: Secondary | ICD-10-CM | POA: Diagnosis not present

## 2023-02-16 DIAGNOSIS — Z0189 Encounter for other specified special examinations: Secondary | ICD-10-CM | POA: Diagnosis not present

## 2023-03-03 ENCOUNTER — Encounter: Payer: Self-pay | Admitting: Nurse Practitioner

## 2023-03-08 ENCOUNTER — Ambulatory Visit
Admission: RE | Admit: 2023-03-08 | Discharge: 2023-03-08 | Disposition: A | Discharge status: Home / Self Care | Payer: Medicare HMO | Source: Ambulatory Visit | Attending: Nurse Practitioner | Admitting: Nurse Practitioner

## 2023-03-08 DIAGNOSIS — Z803 Family history of malignant neoplasm of breast: Secondary | ICD-10-CM | POA: Diagnosis not present

## 2023-03-08 DIAGNOSIS — N6092 Unspecified benign mammary dysplasia of left breast: Secondary | ICD-10-CM

## 2023-03-08 DIAGNOSIS — N6001 Solitary cyst of right breast: Secondary | ICD-10-CM | POA: Diagnosis not present

## 2023-03-08 DIAGNOSIS — R92333 Mammographic heterogeneous density, bilateral breasts: Secondary | ICD-10-CM | POA: Diagnosis not present

## 2023-03-08 MED ORDER — GADOPICLENOL 0.5 MMOL/ML IV SOLN
5.0000 mL | Freq: Once | INTRAVENOUS | Status: AC | PRN
  Administered 2023-03-08: 5 mL via INTRAVENOUS

## 2023-03-09 ENCOUNTER — Other Ambulatory Visit: Payer: Self-pay | Admitting: Nurse Practitioner

## 2023-03-09 DIAGNOSIS — R928 Other abnormal and inconclusive findings on diagnostic imaging of breast: Secondary | ICD-10-CM

## 2023-03-23 IMAGING — MG MM BREAST BX W/ LOC DEV 1ST LESION IMAGE BX SPEC STEREO GUIDE*R*
3 series · 3 of 11 positions shown · non-contrast
Comparison: Previous exams.
COMPARISON: Previous exams.

Addendum:
CLINICAL DATA: Patient presents for stereotactic core needle biopsy
of architectural distortions in each breast.

EXAM:
LEFT BREAST STEREOTACTIC CORE NEEDLE BIOPSY: #1.
RIGHT BREAST STEREOTACTIC CORE NEEDLE BIOPSY: #2.

[L CC]
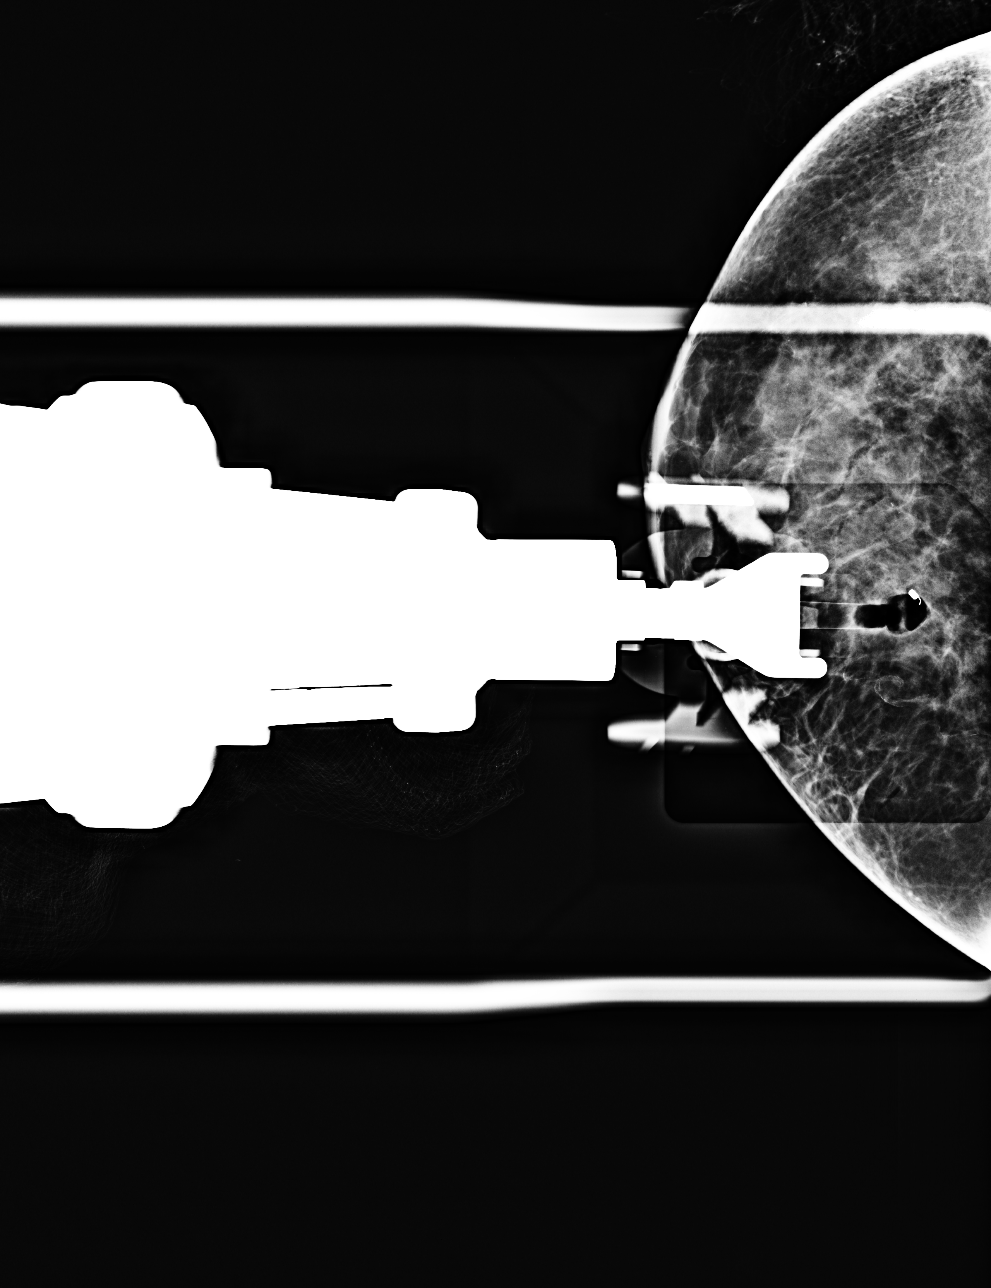

[L CC tomo (1 of 2) · tomo slice 31/60.0]
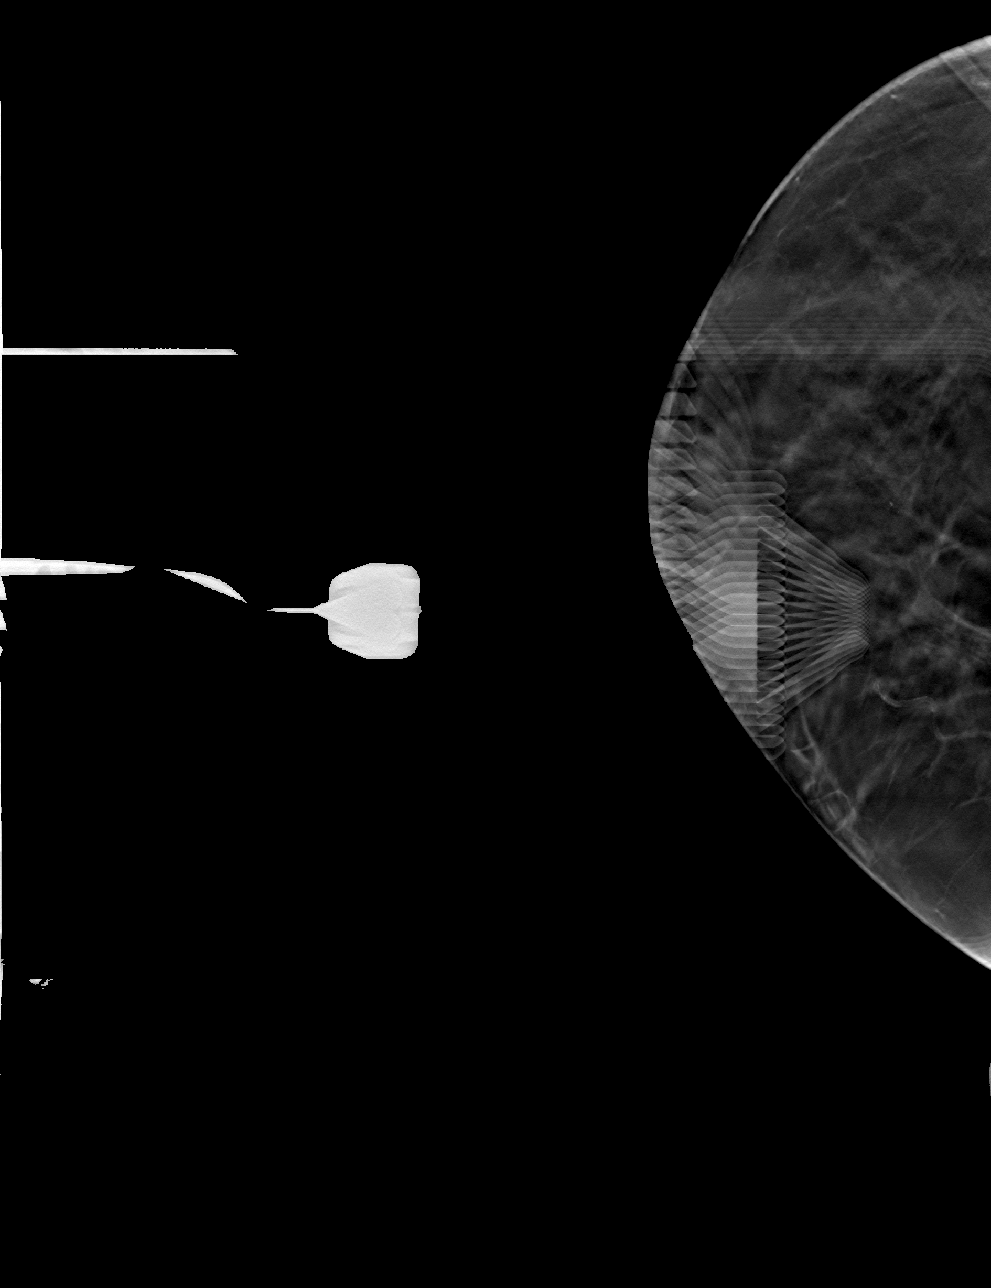

[L CC tomo (2 of 2) · tomo slice 31/61.0]
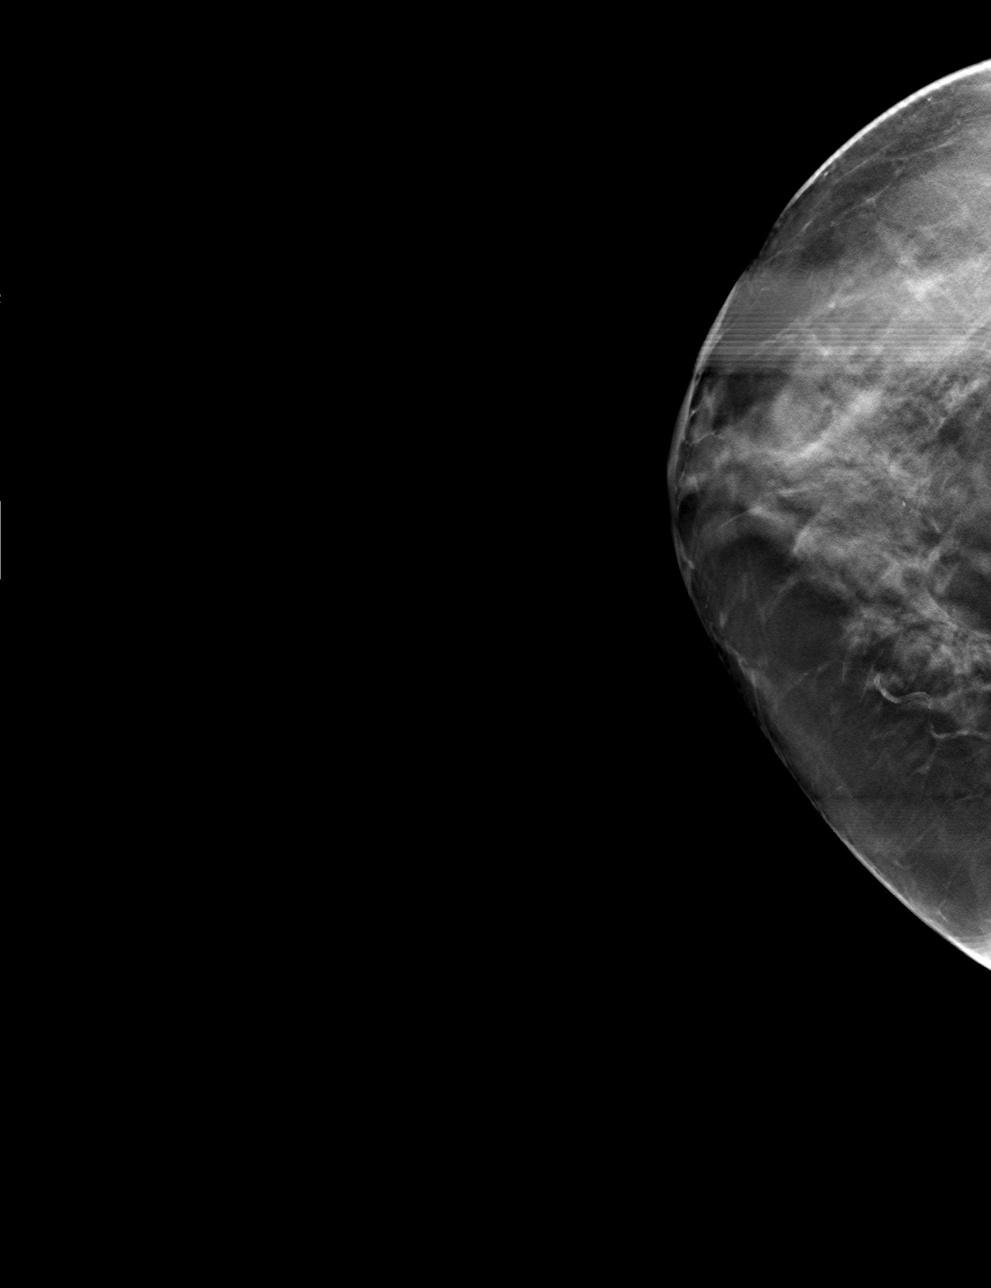

[3 of 11 positions shown; findings below may reference images not displayed]



Lesion #1: Left breast, lower, outer quadrant.

Using sterile technique and 1% Lidocaine as local anesthetic, under
stereotactic guidance, a 9 gauge vacuum assisted device was used to
perform core needle biopsy of calcifications in the lower outer
quadrant of the left breast using a superior approach.

Lesion quadrant: Lower outer quadrant

At the conclusion of the procedure, coil shaped tissue marker clip
was deployed into the biopsy cavity.

Lesion #2: Right breast, lower, outer quadrant.

Using sterile technique and 1% Lidocaine as local anesthetic, under
stereotactic guidance, a 9 gauge vacuum assisted device was used to
perform core needle biopsy of calcifications in the lower outer
quadrant of the left breast using a superior approach.

Lesion quadrant: Lower outer quadrant

At the conclusion of the procedure, ribbon shaped tissue marker clip
was deployed into the biopsy cavity.

Follow-up 2-view mammogram was performed and dictated separately.
IMPRESSION: Stereotactic-guided biopsy of architectural distortions, 1 in each
breast. No apparent complications.

ADDENDUM:
Pathology revealed COMPLEX SCLEROSING LESION WITH USUAL DUCTAL
HYPERPLASIA of the LEFT breast, lower outer quadrant, (coil clip).
This was found to be concordant by Dr. Jhummun Satroghun, with surgical
consultation to discuss options.

Pathology revealed COMPLEX SCLEROSING LESION of the RIGHT breast,
lower outer quadrant, (ribbon clip). This was found to be concordant
by Dr. Jhummun Satroghun, with surgical consultation to discuss options.

Pathology results were discussed with the patient by telephone. The
patient reported doing well after the biopsies with tenderness at
the sites. Post biopsy instructions and care were reviewed and
questions were answered. The patient was encouraged to call The
direct phone number was provided.

Surgical consultation has been arranged with Dr. Kwong Elain
at [REDACTED] on March 05, 2021.

NOTE: If surgical excision is not pursued, consider BILATERAL
diagnostic mammogram every 6 months for 2 years.

Pathology results reported by Hipsy Allhasan, RN on 02/19/2021.



Lesion #1: Left breast, lower, outer quadrant.

Using sterile technique and 1% Lidocaine as local anesthetic, under
stereotactic guidance, a 9 gauge vacuum assisted device was used to
perform core needle biopsy of calcifications in the lower outer
quadrant of the left breast using a superior approach.

Lesion quadrant: Lower outer quadrant

At the conclusion of the procedure, coil shaped tissue marker clip
was deployed into the biopsy cavity.

Lesion #2: Right breast, lower, outer quadrant.

Using sterile technique and 1% Lidocaine as local anesthetic, under
stereotactic guidance, a 9 gauge vacuum assisted device was used to
perform core needle biopsy of calcifications in the lower outer
quadrant of the left breast using a superior approach.

Lesion quadrant: Lower outer quadrant

At the conclusion of the procedure, ribbon shaped tissue marker clip
was deployed into the biopsy cavity.

Follow-up 2-view mammogram was performed and dictated separately.
IMPRESSION: Stereotactic-guided biopsy of architectural distortions, 1 in each
breast. No apparent complications.

## 2023-03-23 IMAGING — MG MM BREAST LOCALIZATION CLIP
4 series · 4 of 12 positions shown · non-contrast
Comparison: Previous exam(s).

CLINICAL DATA: Evaluate post biopsy marker clip placements
following stereotactic core needle biopsy architectural distortions,
1 in each breast.

EXAM:
3D DIAGNOSTIC BILATERAL MAMMOGRAM POST STEREOTACTIC BIOPSY

[L CC synth-2D]
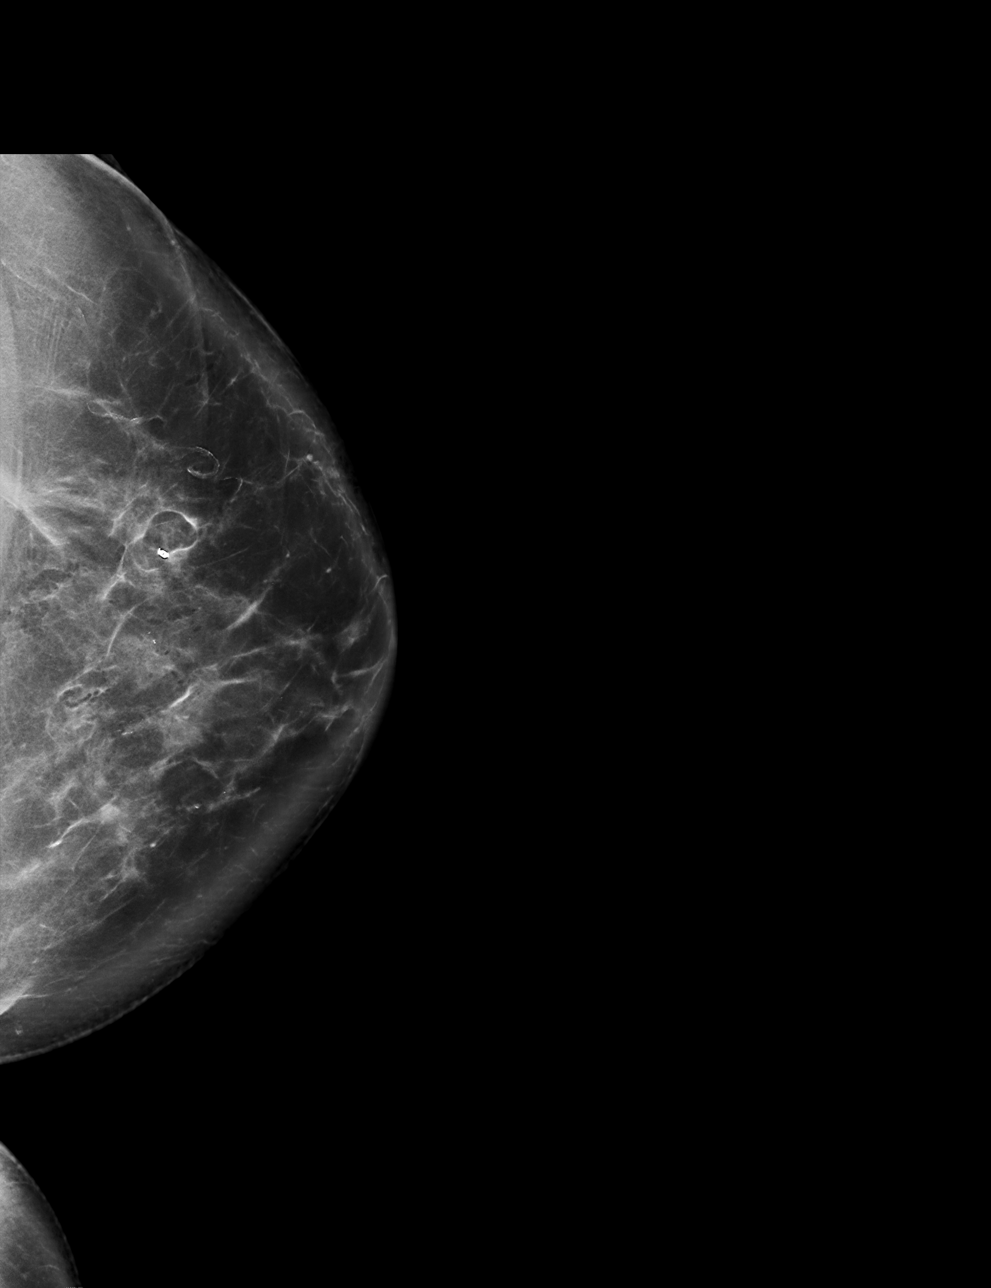

[L ML synth-2D]
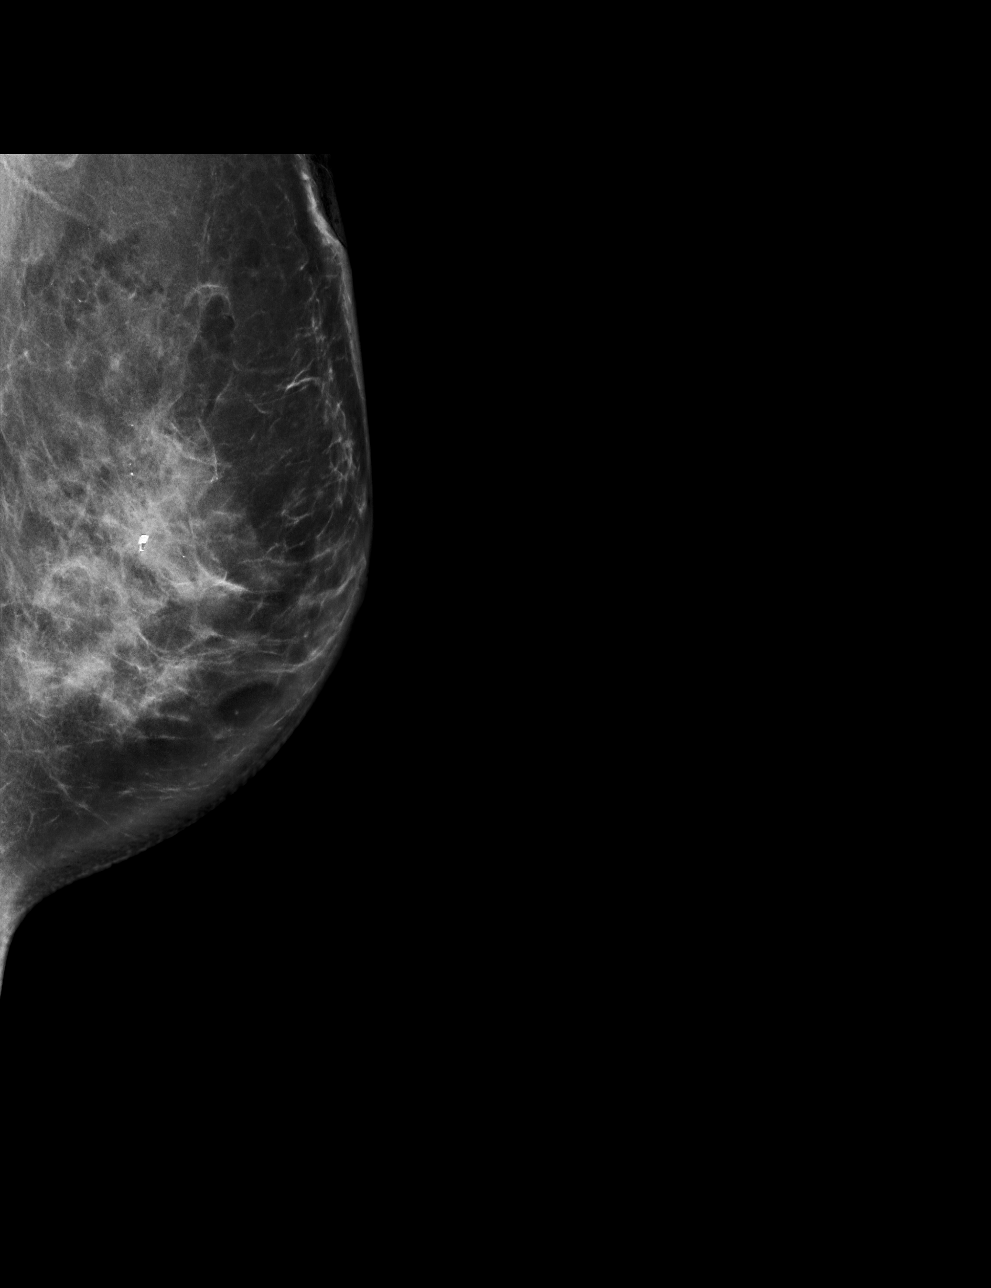

[L CC tomo · tomo slice 47/94.0]
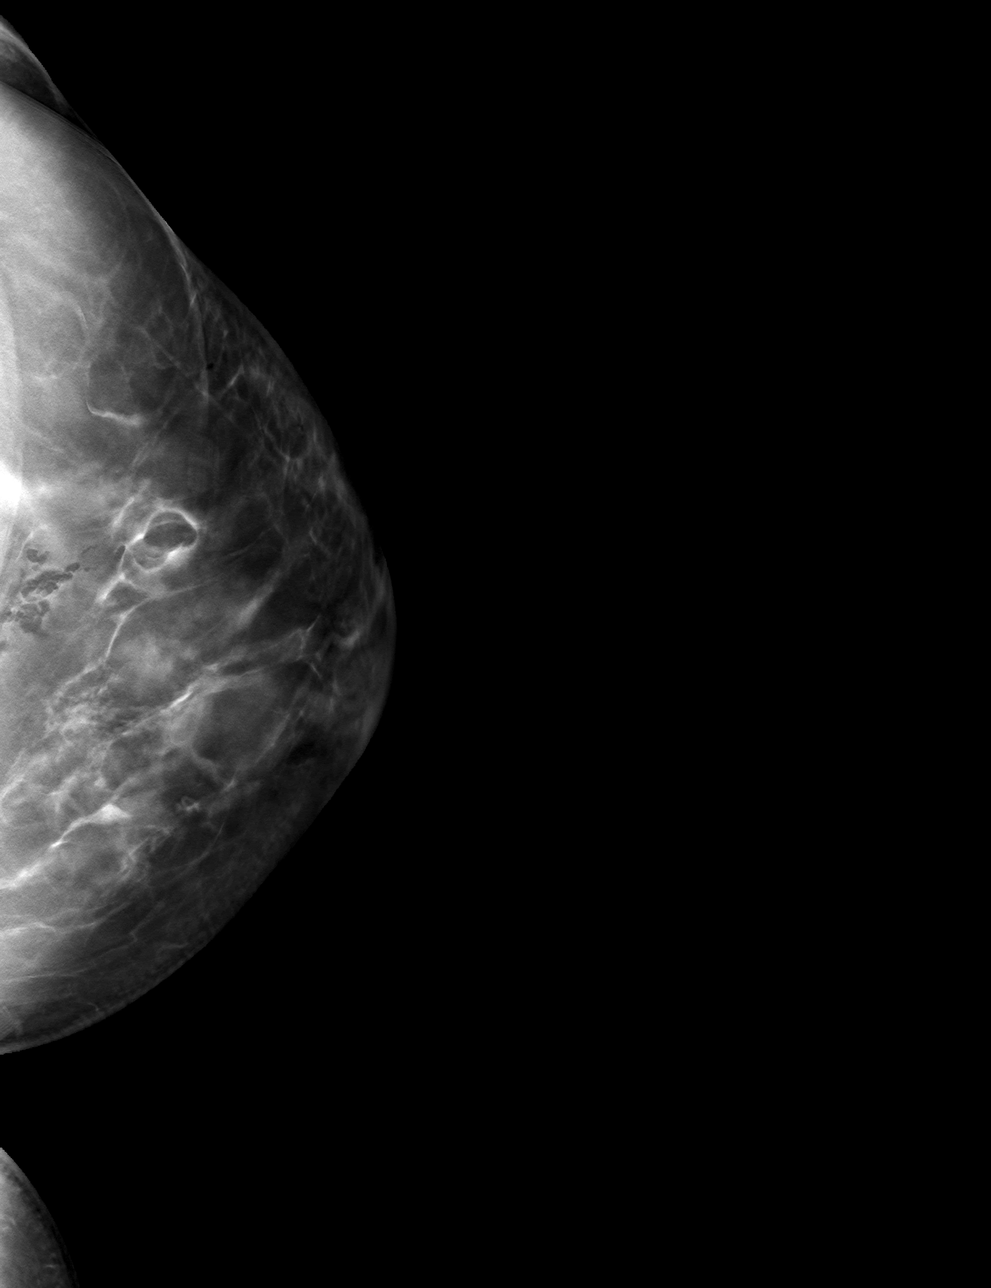

[L ML tomo · tomo slice 47/94.0]
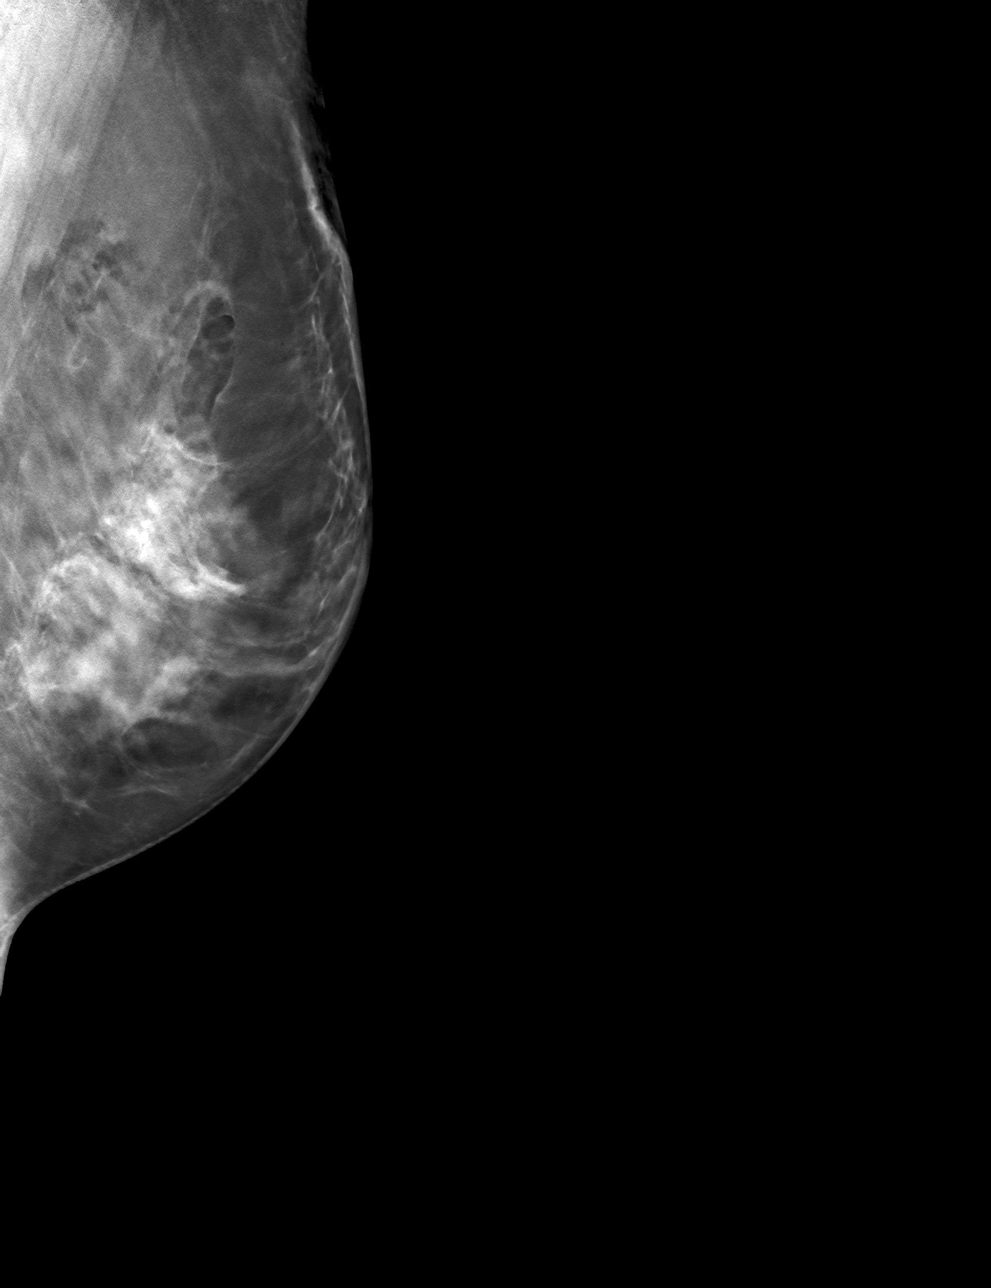

[4 of 12 positions shown; findings below may reference images not displayed]

FINDINGS: 3D Mammographic images were obtained following stereotactic guided
biopsy of a lateral left breast architectural distortion. The biopsy
marking clip is in expected position at the site of biopsy.

3D Mammographic images were obtained following stereotactic guided
biopsy of a right breast architectural distortion. The biopsy
marking clip is in expected position at the site of biopsy.
IMPRESSION: Appropriate positioning of the coil shaped biopsy marking clip at
the site of biopsy in the lateral left breast.

Appropriate positioning of the ribbon shaped biopsy marking clip at
the site of biopsy in the lateral right breast.

Final Assessment: Post Procedure Mammograms for Marker Placement

## 2023-04-08 ENCOUNTER — Ambulatory Visit
Admission: RE | Admit: 2023-04-08 | Discharge: 2023-04-08 | Disposition: A | Discharge status: Home / Self Care | Payer: Medicare HMO | Source: Ambulatory Visit | Attending: Nurse Practitioner | Admitting: Nurse Practitioner

## 2023-04-08 ENCOUNTER — Ambulatory Visit: Payer: Medicare HMO

## 2023-04-08 DIAGNOSIS — N6489 Other specified disorders of breast: Secondary | ICD-10-CM | POA: Diagnosis not present

## 2023-04-08 DIAGNOSIS — R928 Other abnormal and inconclusive findings on diagnostic imaging of breast: Secondary | ICD-10-CM

## 2023-05-26 ENCOUNTER — Ambulatory Visit: Payer: Medicare HMO | Admitting: Podiatry

## 2023-05-26 ENCOUNTER — Ambulatory Visit: Payer: Medicare HMO

## 2023-05-26 DIAGNOSIS — G5762 Lesion of plantar nerve, left lower limb: Secondary | ICD-10-CM | POA: Diagnosis not present

## 2023-05-26 DIAGNOSIS — M79671 Pain in right foot: Secondary | ICD-10-CM | POA: Diagnosis not present

## 2023-05-26 DIAGNOSIS — M79672 Pain in left foot: Secondary | ICD-10-CM

## 2023-05-26 DIAGNOSIS — G5761 Lesion of plantar nerve, right lower limb: Secondary | ICD-10-CM | POA: Diagnosis not present

## 2023-05-26 MED ORDER — METHYLPREDNISOLONE 4 MG PO TBPK: ORAL_TABLET | ORAL | 0 refills | Status: AC

## 2023-05-26 NOTE — Progress Notes (Signed)
Chief Complaint  Patient presents with   Foot Pain    Bilat feet pain, has been having the pain since June, having pain at the ball of the foot   HPI: 62 y.o. female presents today with concern of pain in the ball of the foot.  States that this has been bilateral but it has been getting better recently.  Patient notes minimal pain today.  Denies any change in activity or shoes around the time of symptom onset.  Denies trauma.  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   Wears glasses     Past Surgical History:  Procedure Laterality Date   BLADDER SUSPENSION  07/2009   BLADDER SUSPENSION N/A 06/23/2021   Procedure: TRANSVAGINAL TAPE (TVT) PROCEDURE;  Surgeon: Marguerita Beards, MD;  Location[facility];  Service: Gynecology;  Laterality: N/A;   BREAST BIOPSY Left 02/16/2021   BREAST BIOPSY Right 02/16/2021   colonscopy  2009   CYSTOSCOPY N/A 06/23/2021   Procedure: CYSTOSCOPY;  Surgeon: Marguerita Beards, MD;  Location: [facility];  Service: Gynecology;  Laterality: N/A;   galbladder  11/2005   laparoscopic   GANGLION CYST EXCISION Right 2001   x 2 on right   left hand surgery Left    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   multiple skin graft taken off both hips and left wrist and stomach     to cover hands   RECTOCELE REPAIR  07/2009   with vaginal mesh   ROBOTIC ASSISTED LAPAROSCOPIC SACROCOLPOPEXY N/A 06/23/2021   Procedure: XI ROBOTIC ASSISTED LAPAROSCOPIC SACROCOLPOPEXY;  Surgeon: Marguerita Beards, MD;  Location: [facility];  Service: Gynecology;  Laterality: N/A;   UPPER GI ENDOSCOPY  2012    VAGINAL HYSTERECTOMY  07/2009    Allergies  Allergen Reactions   Alpha-Gal Anaphylaxis and Hives   Galactose Anaphylaxis and Other (See Comments)    Alpha Gal allergy (meat allergy)   Misc. Sulfonamide Containing Compounds Other (See Comments) and Swelling   Penicillins Hives, Itching and Other (See Comments)   Sulfa Antibiotics Other (See Comments), Rash and Swelling   Trimethoprim Other (See Comments), Rash and Swelling    Physical Exam: Palpable pedal pulses bilaterally.  Negative Mulder sign of the interspaces but there is some reproduction of mild pain in the second interspace with compression bilateral.  No pain with range of motion of the metatarsophalangeal joints.  No plantar arch pain.  Ankle dorsiflexion is within normal limits.  Radiographic Exam (B/L foot, 3wb views):  Normal osseous mineralization. Joint spaces preserved.  No fractures or osseous irregularities noted.  Assessment/Plan of Care: 1. 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 Foot pain, bilateral   3. Neuroma of second interspace of left foot     Meds ordered this encounter  Medications   methylPREDNISolone (MEDROL DOSEPAK) 4 MG TBPK tablet    Sig: Take as directed    Dispense:  21 tablet    Refill:  0   DG FOOT 2 VIEWS RIGHT DG FOOT 2 VIEWS LEFT  Discussed clinical findings with patient today.  Patient fitted for power step inserts to help disperse the pressure on the plantar aspect of feet more appropriately and hopefully decrease pressure to the forefoot.  Discussed a cortisone injection but recommend holding off today since her symptoms are so much better than when she called to make the appointment.  Informed her that this is the standard treatment for neuromas, so if her symptoms return she can call the office for injection.  Prescription for Medrol Dosepak was sent to the patient's pharmacy to hold onto and begin taking if her symptoms return.  Follow-up as needed   Clerance Lav, DPM, FACFAS [facility]     [address] [place], [place] [address]                Office [phone]  Fax [phone]

## 2023-07-21 DIAGNOSIS — Z1331 Encounter for screening for depression: Secondary | ICD-10-CM | POA: Diagnosis not present

## 2023-07-21 DIAGNOSIS — E785 Hyperlipidemia, unspecified: Secondary | ICD-10-CM | POA: Diagnosis not present

## 2023-07-21 DIAGNOSIS — Z6820 Body mass index (BMI) 20.0-20.9, adult: Secondary | ICD-10-CM | POA: Diagnosis not present

## 2023-07-21 DIAGNOSIS — Z780 Asymptomatic menopausal state: Secondary | ICD-10-CM | POA: Diagnosis not present

## 2023-07-21 DIAGNOSIS — E538 Deficiency of other specified B group vitamins: Secondary | ICD-10-CM | POA: Diagnosis not present

## 2024-01-18 DIAGNOSIS — E785 Hyperlipidemia, unspecified: Secondary | ICD-10-CM | POA: Diagnosis not present

## 2024-01-18 DIAGNOSIS — Z6820 Body mass index (BMI) 20.0-20.9, adult: Secondary | ICD-10-CM | POA: Diagnosis not present

## 2024-01-18 DIAGNOSIS — E538 Deficiency of other specified B group vitamins: Secondary | ICD-10-CM | POA: Diagnosis not present

## 2024-01-18 DIAGNOSIS — Z1231 Encounter for screening mammogram for malignant neoplasm of breast: Secondary | ICD-10-CM | POA: Diagnosis not present

## 2024-01-18 DIAGNOSIS — E559 Vitamin D deficiency, unspecified: Secondary | ICD-10-CM | POA: Diagnosis not present

## 2024-01-18 DIAGNOSIS — Z780 Asymptomatic menopausal state: Secondary | ICD-10-CM | POA: Diagnosis not present

## 2024-01-18 DIAGNOSIS — Z9181 History of falling: Secondary | ICD-10-CM | POA: Diagnosis not present
